# Patient Record
Sex: Female | Born: 1935 | Race: White | Hispanic: No | Smoking: Never smoker
Health system: Southern US, Community
[De-identification: ages and names within clinical notes are randomized; demographics above are authoritative.]

## PROBLEM LIST (undated history)

## (undated) DIAGNOSIS — I341 Nonrheumatic mitral (valve) prolapse: Secondary | ICD-10-CM

## (undated) DIAGNOSIS — G709 Myoneural disorder, unspecified: Secondary | ICD-10-CM

## (undated) DIAGNOSIS — G629 Polyneuropathy, unspecified: Secondary | ICD-10-CM

## (undated) DIAGNOSIS — C801 Malignant (primary) neoplasm, unspecified: Secondary | ICD-10-CM

## (undated) DIAGNOSIS — R011 Cardiac murmur, unspecified: Secondary | ICD-10-CM

## (undated) DIAGNOSIS — K219 Gastro-esophageal reflux disease without esophagitis: Secondary | ICD-10-CM

## (undated) DIAGNOSIS — G930 Cerebral cysts: Secondary | ICD-10-CM

## (undated) DIAGNOSIS — I4891 Unspecified atrial fibrillation: Secondary | ICD-10-CM

## (undated) DIAGNOSIS — M1990 Unspecified osteoarthritis, unspecified site: Secondary | ICD-10-CM

## (undated) DIAGNOSIS — M549 Dorsalgia, unspecified: Secondary | ICD-10-CM

## (undated) DIAGNOSIS — I639 Cerebral infarction, unspecified: Secondary | ICD-10-CM

## (undated) DIAGNOSIS — D510 Vitamin B12 deficiency anemia due to intrinsic factor deficiency: Secondary | ICD-10-CM

## (undated) DIAGNOSIS — G8929 Other chronic pain: Secondary | ICD-10-CM

## (undated) DIAGNOSIS — K449 Diaphragmatic hernia without obstruction or gangrene: Secondary | ICD-10-CM

## (undated) DIAGNOSIS — D689 Coagulation defect, unspecified: Secondary | ICD-10-CM

## (undated) DIAGNOSIS — Q8740 Marfan's syndrome, unspecified: Secondary | ICD-10-CM

## (undated) DIAGNOSIS — D649 Anemia, unspecified: Secondary | ICD-10-CM

## (undated) DIAGNOSIS — C169 Malignant neoplasm of stomach, unspecified: Secondary | ICD-10-CM

## (undated) DIAGNOSIS — C55 Malignant neoplasm of uterus, part unspecified: Secondary | ICD-10-CM

## (undated) DIAGNOSIS — M75101 Unspecified rotator cuff tear or rupture of right shoulder, not specified as traumatic: Secondary | ICD-10-CM

## (undated) DIAGNOSIS — F419 Anxiety disorder, unspecified: Secondary | ICD-10-CM

## (undated) DIAGNOSIS — K589 Irritable bowel syndrome without diarrhea: Secondary | ICD-10-CM

## (undated) DIAGNOSIS — R32 Unspecified urinary incontinence: Secondary | ICD-10-CM

## (undated) DIAGNOSIS — I10 Essential (primary) hypertension: Secondary | ICD-10-CM

## (undated) DIAGNOSIS — Z5189 Encounter for other specified aftercare: Secondary | ICD-10-CM

## (undated) DIAGNOSIS — O2230 Deep phlebothrombosis in pregnancy, unspecified trimester: Secondary | ICD-10-CM

## (undated) DIAGNOSIS — F32A Depression, unspecified: Secondary | ICD-10-CM

## (undated) DIAGNOSIS — F329 Major depressive disorder, single episode, unspecified: Secondary | ICD-10-CM

## (undated) DIAGNOSIS — N189 Chronic kidney disease, unspecified: Secondary | ICD-10-CM

## (undated) DIAGNOSIS — H3530 Unspecified macular degeneration: Secondary | ICD-10-CM

## (undated) DIAGNOSIS — E049 Nontoxic goiter, unspecified: Secondary | ICD-10-CM

## (undated) HISTORY — DX: Other chronic pain: G89.29

## (undated) HISTORY — DX: Anemia, unspecified: D64.9

## (undated) HISTORY — DX: Unspecified macular degeneration: H35.30

## (undated) HISTORY — PX: NOSE SURGERY: SHX723

## (undated) HISTORY — DX: Unspecified osteoarthritis, unspecified site: M19.90

## (undated) HISTORY — DX: Unspecified atrial fibrillation: I48.91

## (undated) HISTORY — DX: Polyneuropathy, unspecified: G62.9

## (undated) HISTORY — DX: Major depressive disorder, single episode, unspecified: F32.9

## (undated) HISTORY — DX: Encounter for other specified aftercare: Z51.89

## (undated) HISTORY — DX: Unspecified urinary incontinence: R32

## (undated) HISTORY — DX: Depression, unspecified: F32.A

## (undated) HISTORY — DX: Malignant neoplasm of uterus, part unspecified: C55

## (undated) HISTORY — DX: Gastro-esophageal reflux disease without esophagitis: K21.9

## (undated) HISTORY — DX: Vitamin B12 deficiency anemia due to intrinsic factor deficiency: D51.0

## (undated) HISTORY — DX: Dorsalgia, unspecified: M54.9

## (undated) HISTORY — DX: Cardiac murmur, unspecified: R01.1

## (undated) HISTORY — DX: Nonrheumatic mitral (valve) prolapse: I34.1

## (undated) HISTORY — DX: Unspecified rotator cuff tear or rupture of right shoulder, not specified as traumatic: M75.101

## (undated) HISTORY — DX: Malignant (primary) neoplasm, unspecified: C80.1

## (undated) HISTORY — DX: Myoneural disorder, unspecified: G70.9

## (undated) HISTORY — DX: Anxiety disorder, unspecified: F41.9

## (undated) HISTORY — DX: Diaphragmatic hernia without obstruction or gangrene: K44.9

## (undated) HISTORY — DX: Coagulation defect, unspecified: D68.9

## (undated) HISTORY — DX: Deep phlebothrombosis in pregnancy, unspecified trimester: O22.30

## (undated) HISTORY — DX: Cerebral cysts: G93.0

## (undated) HISTORY — PX: EYE SURGERY: SHX253

## (undated) HISTORY — DX: Nontoxic goiter, unspecified: E04.9

## (undated) HISTORY — DX: Cerebral infarction, unspecified: I63.9

## (undated) HISTORY — DX: Irritable bowel syndrome, unspecified: K58.9

## (undated) HISTORY — DX: Marfan syndrome, unspecified: Q87.40

## (undated) HISTORY — PX: CATARACT EXTRACTION: SUR2

---

## 1970-10-19 HISTORY — PX: ABDOMINAL HYSTERECTOMY: SHX81

## 1983-10-20 HISTORY — PX: ANTERIOR CERVICAL DISCECTOMY: SHX1160

## 2010-07-16 ENCOUNTER — Ambulatory Visit: Payer: Self-pay | Admitting: Cardiovascular Disease

## 2010-07-23 ENCOUNTER — Encounter: Payer: Self-pay | Admitting: Emergency Medicine

## 2010-07-31 ENCOUNTER — Ambulatory Visit: Payer: Self-pay | Admitting: Cardiology

## 2010-07-31 ENCOUNTER — Inpatient Hospital Stay (HOSPITAL_COMMUNITY)
Admission: EM | Admit: 2010-07-31 | Discharge: 2010-08-07 | Payer: Self-pay | Source: Home / Self Care | Admitting: Emergency Medicine

## 2010-08-05 ENCOUNTER — Ambulatory Visit: Payer: Self-pay | Admitting: Physical Medicine & Rehabilitation

## 2010-08-12 ENCOUNTER — Ambulatory Visit: Payer: Self-pay | Admitting: Emergency Medicine

## 2010-08-12 DIAGNOSIS — J309 Allergic rhinitis, unspecified: Secondary | ICD-10-CM | POA: Insufficient documentation

## 2010-08-12 DIAGNOSIS — I4891 Unspecified atrial fibrillation: Secondary | ICD-10-CM

## 2010-08-12 DIAGNOSIS — C549 Malignant neoplasm of corpus uteri, unspecified: Secondary | ICD-10-CM

## 2010-08-12 DIAGNOSIS — R05 Cough: Secondary | ICD-10-CM | POA: Insufficient documentation

## 2010-08-12 DIAGNOSIS — Z85828 Personal history of other malignant neoplasm of skin: Secondary | ICD-10-CM

## 2010-08-12 DIAGNOSIS — Z8679 Personal history of other diseases of the circulatory system: Secondary | ICD-10-CM | POA: Insufficient documentation

## 2010-08-12 DIAGNOSIS — I743 Embolism and thrombosis of arteries of the lower extremities: Secondary | ICD-10-CM

## 2010-08-15 ENCOUNTER — Ambulatory Visit: Payer: Self-pay | Admitting: Cardiovascular Disease

## 2010-08-19 ENCOUNTER — Encounter: Payer: Self-pay | Admitting: Emergency Medicine

## 2010-08-19 ENCOUNTER — Ambulatory Visit (HOSPITAL_COMMUNITY): Admission: RE | Admit: 2010-08-19 | Discharge: 2010-08-19 | Payer: Self-pay | Admitting: Emergency Medicine

## 2010-09-03 ENCOUNTER — Telehealth (INDEPENDENT_AMBULATORY_CARE_PROVIDER_SITE_OTHER): Payer: Self-pay | Admitting: *Deleted

## 2010-09-04 ENCOUNTER — Ambulatory Visit: Payer: Self-pay | Admitting: Emergency Medicine

## 2010-09-04 DIAGNOSIS — J984 Other disorders of lung: Secondary | ICD-10-CM

## 2010-11-08 ENCOUNTER — Encounter: Payer: Self-pay | Admitting: Cardiology

## 2010-11-18 ENCOUNTER — Ambulatory Visit: Payer: Self-pay | Admitting: Cardiovascular Disease

## 2010-11-18 NOTE — Progress Notes (Signed)
Summary: MBSS results - OV scheduled  Phone Note Call from Patient Call back at [phone]   Caller: Daughter cindy Call For: byrum Summary of Call: calling for swallowing test results Initial call taken by: Rickard Patience,  September 03, 2010 10:45 AM  Follow-up for Phone Call        Per last OV note from 08/12/10, pt was to have MBSS and follow up wtih RB after test completed.  Results are in EMR.    Called, spoke with pt's daughter, Arline Asp.  She was informed per last OV, RB wanted to pt f/u after test completed.  She is ok with this.  ROV scheduled for 09/04/10 at 10am.   Follow-up by: Gweneth Dimitri RN,  September 03, 2010 10:57 AM

## 2010-11-18 NOTE — Letter (Signed)
[facility]   Imported By: Sherian Rein 08/20/2010 08:27:47  _____________________________________________________________________  External Attachment:    Type:   Image     Comment:   External Document

## 2010-11-18 NOTE — Miscellaneous (Signed)
Summary: [facility]  [place]   Imported By: Lester Saguache 08/26/2010 08:59:45  _____________________________________________________________________  External Attachment:    Type:   Image     Comment:   External Document

## 2010-11-18 NOTE — Assessment & Plan Note (Signed)
Summary: cough, dysphagia, lingular nodule   Visit Type:  Follow-up Copy to:  Dr. Benedetto Goad Primary Provider/Referring Provider:  Dr. Benedetto Goad  CC:  Patient here to discuss swallow results...c/o a hacky cough.  History of Present Illness: 77 yo never smoker, hx of RLE DVT, IBS, A Fib, Marfan;s, MV prolapse. She was admitted for CP and SOB 07/31/10. She underwent stress testing, V/Q. CXR showed lingular nodule. Referred by Dr [name] for cough and dyspnea. She describes a chest congestion that began this Summer when she got URI. Has persistant hoarse voice. She describes slow progressive dysphagia, difficulty swallowing, getting choked associated with food and drink. Coughing fits that then produce thick clear mucous. She reports that she has been on a modified diet before but not now.   ROV 09/04/10 -- returns for cough that is worst with taking food by mouth. Also hoarse voice, UA mucous  I sent her for barium swallow, shows poor transit of solids at upper esophagus until liquids follow. No overt aspiration. She is on fluticasone spray. Also note lingular nodule seen on CXR done by Dr [name].     Current Medications (verified): 1)  Vitamin B-12 1000 Mcg Tabs (Cyanocobalamin) .... Once Daily 2)  Ultram Er 100 Mg Xr24h-Tab (Tramadol Hcl) .... Once Daily 3)  Aspirin 81 Mg Tabs (Aspirin) .... Once Daily 4)  Daily Multiple Vitamins  Tabs (Multiple Vitamin) .... Once Daily 5)  Nexium 40 Mg Cpdr (Esomeprazole Magnesium) .... Once Daily 6)  Miralax  Powd (Polyethylene Glycol 3350) .... As Directed 7)  Senna Tablets .... As Directed 8)  Colace 100 Mg Caps (Docusate Sodium) .... Once Daily 9)  Simvastatin 40 Mg Tabs (Simvastatin) .... Once Daily At Bedtime 10)  Ditropan Xl 10 Mg Xr24h-Tab (Oxybutynin Chloride) .... Once Daily 11)  Fluticasone Propionate 50 Mcg/act Susp (Fluticasone Propionate) .... One Spray Each Nostrile Two Times A Day 12)  Tylenol 8 Hour 650 Mg Cr-Tabs (Acetaminophen) ....  One Tablet Four Times A Day 13)  Neurontin 400 Mg Caps (Gabapentin) .... 3 Capsules Three Times A Day 14)  Coumadin 3 Mg Tabs (Warfarin Sodium) .... Use As Directed 15)  Alphagan P 0.15 % Soln (Brimonidine Tartrate) .... Instill Drops Every 12 Hours  Allergies (verified): 1)  ! * Pcn and All Derivatives  Vital Signs:  Patient profile:   77 year old female Height:      69 inches (175.26 cm) Weight:      187.19 pounds (85.09 kg) BMI:     27.74 O2 Sat:      96 % on Room air Temp:     97.6 degrees F (36.44 degrees C) oral Pulse rate:   90 / minute BP sitting:   108 / 64  (left arm) Cuff size:   large  Vitals Entered By: Michel Bickers CMA (September 04, 2010 10:12 AM)  O2 Sat at Rest %:  96 O2 Flow:  Room air CC: Patient here to discuss swallow results...c/o a hacky cough Comments Medications reviewed with patient Michel Bickers CMA  September 04, 2010 10:19 AM   Physical Exam  General:  debilitated woman in wheelchair Head:  Marfans Eyes:  conjunctiva and sclera clear Nose:  no deformity, discharge, inflammation, or lesions Mouth:  no deformity or lesions Neck:  no masses, thyromegaly, or abnormal cervical nodes Lungs:  clear bilaterally Heart:  regular rate and rhythm, S1, S2 without murmurs, rubs, gallops, or clicks Abdomen:  not examined Extremities:  large hands, no edema Neurologic:  weak R LE and UE Psych:  alert and cooperative; normal mood and affect; normal attention span and concentration   Impression & Recommendations:  Problem # 1:  COUGH (ICD-786.2)  - stop your fluticasone spray temporarily until your nose bleeding resolves - start loratadine 10mg  once daily  - eat small bites and follow each bite with a liquid.  - we may decide at some point to refer you to gastroenterology regarding your swallowing.  - you may restart your mucinex - try your Nathaniel to avoid throat clearing - follow up with Dr Delton Coombes in 4 months with a CXR  Orders: Est. Patient Level IV  (04540)  Problem # 2:  PULMONARY NODULE (ICD-518.89)  - repeat CXR next time to follow nodule (never smoker)  Orders: Est. Patient Level IV (98119)  Medications Added to Medication List This Visit: 1)  Coumadin 3 Mg Tabs (Warfarin sodium) .... Use as directed  Patient Instructions: 1)  - stop your fluticasone spray temporarily until your nose bleeding resolves 2)  - start loratadine 10mg  once daily  3)  - eat small bites and follow each bite with a liquid.  4)  - we may decide at some point to refer you to gastroenterology regarding your swallowing.  5)  - you may restart your mucinex 6)  - try your Leighty to avoid throat clearing 7)  - follow up with Dr Delton Coombes in 4 months with a CXR

## 2010-11-18 NOTE — Assessment & Plan Note (Signed)
Summary: cough, dysphagia   Visit Type:  Initial Consult Copy to:  Dr. Benedetto Goad Primary Donnamae [place]/Referring Victorina Kable:  Dr. Benedetto Goad  CC:  Pulmonary consult....  History of Present Illness: 77 yo never smoker, hx of RLE DVT, IBS, A Fib, Marfan;s, MV prolapse. She was admitted for CP and SOB 07/31/10. She underwent stress testing, V/Q that was . CXR showed lingular nodule. Referred by Dr [name] for cough and dyspnea. She describes a chest congestion that began this Summer when she got URI. Has persistant hoarse voice. She describes slow progressive dysphagia, difficulty swallowing, getting choked associated with food and drink. Coughing fits that then produce thick clear mucous. She reports that she has been on a modified diet before but not now.   Preventive Screening-Counseling & Management  Alcohol-Tobacco     Alcohol drinks/day: 0     Smoking Status: never  Current Medications (verified): 1)  Vitamin B-12 1000 Mcg Tabs (Cyanocobalamin) .... Once Daily 2)  Ultram Er 100 Mg Xr24h-Tab (Tramadol Hcl) .... Once Daily 3)  Aspirin 81 Mg Tabs (Aspirin) .... Once Daily 4)  Daily Multiple Vitamins  Tabs (Multiple Vitamin) .... Once Daily 5)  Nexium 40 Mg Cpdr (Esomeprazole Magnesium) .... Once Daily 6)  Miralax  Powd (Polyethylene Glycol 3350) .... As Directed 7)  Mucinex 600 Mg Xr12h-Tab (Guaifenesin) .... One Tablet Two Times A Day As Needed 8)  Senna Tablets .... As Directed 9)  Colace 100 Mg Caps (Docusate Sodium) .... Once Daily 10)  Simvastatin 40 Mg Tabs (Simvastatin) .... Once Daily At Bedtime 11)  Ditropan Xl 10 Mg Xr24h-Tab (Oxybutynin Chloride) .... Once Daily 12)  Fluticasone Propionate 50 Mcg/act Susp (Fluticasone Propionate) .... One Spray Each Nostrile Two Times A Day 13)  Tylenol 8 Hour 650 Mg Cr-Tabs (Acetaminophen) .... One Tablet Four Times A Day 14)  Neurontin 400 Mg Caps (Gabapentin) .... 3 Capsules Three Times A Day 15)  Coumadin 3 Mg Tabs (Warfarin Sodium) .Marland Kitchen.. 1  Tablet Once Daily Except Mon and Thurs 1/2 On Those Days 16)  Alphagan P 0.15 % Soln (Brimonidine Tartrate) .... Instill Drops Every 12 Hours  Allergies (verified): 1)  ! * Pcn and All Derivatives  Past History:  Past Medical History: THROMBOEMBOLISM, LEG (ICD-444.22) SKIN CANCER, HX OF (ICD-V10.83) CANCER, ENDOMETRIUM (ICD-182.0) ANGINA, HX OF (ICD-V12.50), reassuring stress test 10/11.  Hx of ATRIAL FIBRILLATION (ICD-427.31) ALLERGIC RHINITIS (ICD-477.9) IBS Marfan's Syndrome  Past Surgical History: Neck surgery---1985, ant cervical discectomy Hysterectomy in 1972 Cataract Extraction  Family History: Family History Emphysema ---father Family History MI/Heart Attack---father Family History Rheumatoid Arthritisfather CHF---mother Liver cancer---mother Cancer of the blood---father  Social History: Patient never smoked.  Widowed Lives with her daughterAlcohol drinks/day:  0 Smoking Status:  never  Vital Signs:  Patient profile:   77 year old female Height:      69 inches (175.26 cm) Weight:      189 pounds (85.91 kg) BMI:     28.01 O2 Sat:      94 % on Room air Temp:     97.5 degrees F (36.39 degrees C) oral Pulse rate:   99 / minute BP sitting:   118 / 78  (right arm) Cuff size:   regular  Vitals Entered By: Michel Bickers CMA (August 12, 2010 3:48 PM)  O2 Sat at Rest %:  94 O2 Flow:  Room air CC: Pulmonary consult... Is Patient Diabetic? Yes Comments Medications reviewed with patient Michel Bickers [facility]  August 12, 2010 3:48 PM  Physical Exam  General:  debilitated woman in wheelchair Head:  Marfans Eyes:  conjunctiva and sclera clear Nose:  no deformity, discharge, inflammation, or lesions Mouth:  no deformity or lesions Neck:  no masses, thyromegaly, or abnormal cervical nodes Lungs:  clear bilaterally Heart:  regular rate and rhythm, S1, S2 without murmurs, rubs, gallops, or clicks Abdomen:  not examined Extremities:  large hands, no  edema Neurologic:  weak R LE and UE Psych:  alert and cooperative; normal mood and affect; normal attention span and concentration   Impression & Recommendations:  Problem # 1:  COUGH (ICD-786.2) Always assoc with taking by mouth. Strongly suspect she is aspirating. Need to get the modified BS asap, will arrange this today  Medications Added to Medication List This Visit: 1)  Vitamin B-12 1000 Mcg Tabs (Cyanocobalamin) .... Once daily 2)  Ultram Er 100 Mg Xr24h-tab (Tramadol hcl) .... Once daily 3)  Aspirin 81 Mg Tabs (Aspirin) .... Once daily 4)  Daily Multiple Vitamins Tabs (Multiple vitamin) .... Once daily 5)  Nexium 40 Mg Cpdr (Esomeprazole magnesium) .... Once daily 6)  Miralax Powd (Polyethylene glycol 3350) .... As directed 7)  Mucinex 600 Mg Xr12h-tab (Guaifenesin) .... One tablet two times a day as needed 8)  Senna Tablets  .... As directed 9)  Colace 100 Mg Caps (Docusate sodium) .... Once daily 10)  Simvastatin 40 Mg Tabs (Simvastatin) .... Once daily at bedtime 11)  Ditropan Xl 10 Mg Xr24h-tab (Oxybutynin chloride) .... Once daily 12)  Fluticasone Propionate 50 Mcg/act Susp (Fluticasone propionate) .... One spray each nostrile two times a day 13)  Tylenol 8 Hour 650 Mg Cr-tabs (Acetaminophen) .... One tablet four times a day 14)  Neurontin 400 Mg Caps (Gabapentin) .... 3 capsules three times a day 15)  Coumadin 3 Mg Tabs (Warfarin sodium) .Marland Kitchen.. 1 tablet once daily except mon and thurs 1/2 on those days 16)  Alphagan P 0.15 % Soln (Brimonidine tartrate) .... Instill drops every 12 hours  Other Orders: Consultation Level IV (16109) Radiology Referral (Radiology)  Patient Instructions: 1)  We will arrange for your swallowing evaluation 2)  Follow up with Dr Delton Coombes after your testing is completed.

## 2010-12-31 LAB — BASIC METABOLIC PANEL
BUN: 11 mg/dL (ref 6–23)
BUN: 11 mg/dL (ref 6–23)
CO2: 26 mEq/L (ref 19–32)
CO2: 28 mEq/L (ref 19–32)
Calcium: 9.4 mg/dL (ref 8.4–10.5)
Chloride: 105 mEq/L (ref 96–112)
GFR calc Af Amer: >60 mL/min (ref >60)
GFR calc non Af Amer: >60 mL/min (ref >60)
Glucose, Bld: 100 mg/dL — ABNORMAL HIGH (ref 70–99)
Glucose, Bld: 95 mg/dL (ref 70–99)
Sodium: 138 mEq/L (ref 135–145)

## 2010-12-31 LAB — CBC
HCT: 33.4 % — ABNORMAL LOW (ref 36.0–46.0)
HCT: 33.7 % — ABNORMAL LOW (ref 36.0–46.0)
Hemoglobin: 10.9 g/dL — ABNORMAL LOW (ref 12.0–15.0)
Hemoglobin: 11 g/dL — ABNORMAL LOW (ref 12.0–15.0)
MCH: 30.5 pg (ref 26.0–34.0)
MCH: 30.6 pg (ref 26.0–34.0)
MCH: 31 pg (ref 26.0–34.0)
MCHC: 32 g/dL (ref 30.0–36.0)
MCHC: 32.3 g/dL (ref 30.0–36.0)
MCHC: 32.3 g/dL (ref 30.0–36.0)
MCHC: 32.6 g/dL (ref 30.0–36.0)
MCV: 94 fL (ref 78.0–100.0)
MCV: 96.8 fL (ref 78.0–100.0)
Platelets: 197 10*3/uL (ref 150–400)
RDW: 13.9 % (ref 11.5–15.5)
RDW: 14 % (ref 11.5–15.5)
RDW: 14.1 % (ref 11.5–15.5)
WBC: 5 10*3/uL (ref 4.0–10.5)
WBC: 5.7 10*3/uL (ref 4.0–10.5)

## 2010-12-31 LAB — GLUCOSE, CAPILLARY
Glucose-Capillary: 102 mg/dL — ABNORMAL HIGH (ref 70–99)
Glucose-Capillary: 109 mg/dL — ABNORMAL HIGH (ref 70–99)
Glucose-Capillary: 110 mg/dL — ABNORMAL HIGH (ref 70–99)
Glucose-Capillary: 118 mg/dL — ABNORMAL HIGH (ref 70–99)
Glucose-Capillary: 123 mg/dL — ABNORMAL HIGH (ref 70–99)
Glucose-Capillary: 128 mg/dL — ABNORMAL HIGH (ref 70–99)
Glucose-Capillary: 140 mg/dL — ABNORMAL HIGH (ref 70–99)

## 2010-12-31 LAB — PROTIME-INR
INR: 1.69 — ABNORMAL HIGH (ref 0.00–1.49)
INR: 2 — ABNORMAL HIGH (ref 0.00–1.49)
Prothrombin Time: 18.9 seconds — ABNORMAL HIGH (ref 11.6–15.2)
Prothrombin Time: 19.4 seconds — ABNORMAL HIGH (ref 11.6–15.2)
Prothrombin Time: 20.1 seconds — ABNORMAL HIGH (ref 11.6–15.2)
Prothrombin Time: 21.4 seconds — ABNORMAL HIGH (ref 11.6–15.2)
Prothrombin Time: 22.4 seconds — ABNORMAL HIGH (ref 11.6–15.2)

## 2011-01-01 LAB — CBC
HCT: 36.1 % (ref 36.0–46.0)
MCH: 31 pg (ref 26.0–34.0)
MCV: 96.5 fL (ref 78.0–100.0)
Platelets: 186 10*3/uL (ref 150–400)
RDW: 14 % (ref 11.5–15.5)
WBC: 5.1 10*3/uL (ref 4.0–10.5)

## 2011-01-01 LAB — CARDIAC PANEL(CRET KIN+CKTOT+MB+TROPI)
CK, MB: 2.6 ng/mL (ref 0.3–4.0)
Relative Index: 2.2 (ref 0.0–2.5)
Relative Index: 2.4 (ref 0.0–2.5)
Troponin I: 0.02 ng/mL (ref 0.00–0.06)

## 2011-01-01 LAB — DIFFERENTIAL
Basophils Absolute: 0 10*3/uL (ref 0.0–0.1)
Eosinophils Absolute: 0.3 10*3/uL (ref 0.0–0.7)
Eosinophils Relative: 5 % (ref 0–5)
Lymphocytes Relative: 25 % (ref 12–46)
Monocytes Absolute: 0.6 10*3/uL (ref 0.1–1.0)

## 2011-01-01 LAB — BASIC METABOLIC PANEL
CO2: 29 mEq/L (ref 19–32)
Calcium: 9.3 mg/dL (ref 8.4–10.5)
Creatinine, Ser: 0.79 mg/dL (ref 0.4–1.2)
GFR calc Af Amer: >60 mL/min (ref >60)
Sodium: 139 mEq/L (ref 135–145)

## 2011-01-01 LAB — URINALYSIS, ROUTINE W REFLEX MICROSCOPIC
Ketones, ur: NEGATIVE mg/dL
Nitrite: NEGATIVE
Protein, ur: NEGATIVE mg/dL
Urobilinogen, UA: 0.2 mg/dL (ref 0.0–1.0)

## 2011-01-01 LAB — POCT CARDIAC MARKERS
CKMB, poc: <1 ng/mL — ABNORMAL LOW (ref 1.0–8.0)
Myoglobin, poc: 72.2 ng/mL (ref 12–200)
Troponin i, poc: <0.05 ng/mL (ref 0.00–0.09)
Troponin i, poc: <0.05 ng/mL (ref 0.00–0.09)

## 2011-01-01 LAB — CK TOTAL AND CKMB (NOT AT ARMC)
CK, MB: 3.1 ng/mL (ref 0.3–4.0)
Relative Index: 2.4 (ref 0.0–2.5)
Total CK: 128 U/L (ref 7–177)

## 2011-01-01 LAB — URINE MICROSCOPIC-ADD ON

## 2011-01-01 LAB — APTT: aPTT: 49 seconds — ABNORMAL HIGH (ref 24–37)

## 2011-02-16 ENCOUNTER — Emergency Department (HOSPITAL_COMMUNITY): Payer: Medicare Other

## 2011-02-16 ENCOUNTER — Emergency Department (HOSPITAL_COMMUNITY)
Admission: EM | Admit: 2011-02-16 | Discharge: 2011-02-17 | Disposition: A | Discharge status: Home / Self Care | Payer: Medicare Other | Attending: Emergency Medicine | Admitting: Emergency Medicine

## 2011-02-16 DIAGNOSIS — S20229A Contusion of unspecified back wall of thorax, initial encounter: Secondary | ICD-10-CM | POA: Insufficient documentation

## 2011-02-16 DIAGNOSIS — M129 Arthropathy, unspecified: Secondary | ICD-10-CM | POA: Insufficient documentation

## 2011-02-16 DIAGNOSIS — R51 Headache: Secondary | ICD-10-CM | POA: Insufficient documentation

## 2011-02-16 DIAGNOSIS — M545 Low back pain, unspecified: Secondary | ICD-10-CM | POA: Insufficient documentation

## 2011-02-16 DIAGNOSIS — I10 Essential (primary) hypertension: Secondary | ICD-10-CM | POA: Insufficient documentation

## 2011-02-16 DIAGNOSIS — R5381 Other malaise: Secondary | ICD-10-CM | POA: Insufficient documentation

## 2011-02-16 DIAGNOSIS — R0609 Other forms of dyspnea: Secondary | ICD-10-CM | POA: Insufficient documentation

## 2011-02-16 DIAGNOSIS — R071 Chest pain on breathing: Secondary | ICD-10-CM | POA: Insufficient documentation

## 2011-02-16 DIAGNOSIS — I4891 Unspecified atrial fibrillation: Secondary | ICD-10-CM | POA: Insufficient documentation

## 2011-02-16 DIAGNOSIS — R110 Nausea: Secondary | ICD-10-CM | POA: Insufficient documentation

## 2011-02-16 DIAGNOSIS — Z86718 Personal history of other venous thrombosis and embolism: Secondary | ICD-10-CM | POA: Insufficient documentation

## 2011-02-16 DIAGNOSIS — K589 Irritable bowel syndrome without diarrhea: Secondary | ICD-10-CM | POA: Insufficient documentation

## 2011-02-16 DIAGNOSIS — S20219A Contusion of unspecified front wall of thorax, initial encounter: Secondary | ICD-10-CM | POA: Insufficient documentation

## 2011-02-16 DIAGNOSIS — R0989 Other specified symptoms and signs involving the circulatory and respiratory systems: Secondary | ICD-10-CM | POA: Insufficient documentation

## 2011-02-16 DIAGNOSIS — R5383 Other fatigue: Secondary | ICD-10-CM | POA: Insufficient documentation

## 2011-02-16 DIAGNOSIS — E785 Hyperlipidemia, unspecified: Secondary | ICD-10-CM | POA: Insufficient documentation

## 2011-02-16 DIAGNOSIS — Z79899 Other long term (current) drug therapy: Secondary | ICD-10-CM | POA: Insufficient documentation

## 2011-02-16 DIAGNOSIS — W010XXA Fall on same level from slipping, tripping and stumbling without subsequent striking against object, initial encounter: Secondary | ICD-10-CM | POA: Insufficient documentation

## 2011-02-16 DIAGNOSIS — M542 Cervicalgia: Secondary | ICD-10-CM | POA: Insufficient documentation

## 2011-02-16 DIAGNOSIS — Z7982 Long term (current) use of aspirin: Secondary | ICD-10-CM | POA: Insufficient documentation

## 2011-02-16 DIAGNOSIS — Y92009 Unspecified place in unspecified non-institutional (private) residence as the place of occurrence of the external cause: Secondary | ICD-10-CM | POA: Insufficient documentation

## 2011-02-16 DIAGNOSIS — Z7901 Long term (current) use of anticoagulants: Secondary | ICD-10-CM | POA: Insufficient documentation

## 2011-02-16 DIAGNOSIS — IMO0002 Reserved for concepts with insufficient information to code with codable children: Secondary | ICD-10-CM | POA: Insufficient documentation

## 2011-02-16 DIAGNOSIS — G8929 Other chronic pain: Secondary | ICD-10-CM | POA: Insufficient documentation

## 2011-02-16 LAB — DIFFERENTIAL
Lymphocytes Relative: 24 % (ref 12–46)
Lymphs Abs: 1.3 10*3/uL (ref 0.7–4.0)
Neutrophils Relative %: 61 % (ref 43–77)

## 2011-02-16 LAB — CBC
HCT: 37.8 % (ref 36.0–46.0)
Hemoglobin: 12.3 g/dL (ref 12.0–15.0)
MCV: 98.7 fL (ref 78.0–100.0)
RBC: 3.83 MIL/uL — ABNORMAL LOW (ref 3.87–5.11)
WBC: 5.3 10*3/uL (ref 4.0–10.5)

## 2011-02-17 LAB — URINALYSIS, ROUTINE W REFLEX MICROSCOPIC
Glucose, UA: NEGATIVE mg/dL
Ketones, ur: NEGATIVE mg/dL
Protein, ur: NEGATIVE mg/dL

## 2011-02-17 LAB — TROPONIN I: Troponin I: 0.02 ng/mL (ref 0.00–0.06)

## 2011-02-17 LAB — BASIC METABOLIC PANEL
Chloride: 107 mEq/L (ref 96–112)
Creatinine, Ser: 0.74 mg/dL (ref 0.4–1.2)
GFR calc Af Amer: >60 mL/min (ref >60)
Potassium: 3.8 mEq/L (ref 3.5–5.1)
Sodium: 140 mEq/L (ref 135–145)

## 2011-02-17 LAB — BRAIN NATRIURETIC PEPTIDE: Pro B Natriuretic peptide (BNP): 164 pg/mL — ABNORMAL HIGH (ref 0.0–100.0)

## 2011-02-17 LAB — CK TOTAL AND CKMB (NOT AT ARMC)
CK, MB: 3.4 ng/mL (ref 0.3–4.0)
Total CK: 104 U/L (ref 7–177)

## 2011-03-02 ENCOUNTER — Other Ambulatory Visit: Payer: Self-pay | Admitting: Family Medicine

## 2011-03-04 ENCOUNTER — Ambulatory Visit
Admission: RE | Admit: 2011-03-04 | Discharge: 2011-03-04 | Disposition: A | Discharge status: Home / Self Care | Payer: Medicare Other | Source: Ambulatory Visit | Attending: Family Medicine | Admitting: Family Medicine

## 2011-04-24 ENCOUNTER — Emergency Department (HOSPITAL_COMMUNITY): Payer: Medicare Other

## 2011-04-24 ENCOUNTER — Inpatient Hospital Stay (HOSPITAL_COMMUNITY)
Admission: EM | Admit: 2011-04-24 | Discharge: 2011-04-30 | DRG: 103 | Disposition: A | Discharge status: Skilled Nursing Facility | Payer: Medicare Other | Attending: Internal Medicine | Admitting: Internal Medicine

## 2011-04-24 DIAGNOSIS — G959 Disease of spinal cord, unspecified: Secondary | ICD-10-CM | POA: Diagnosis present

## 2011-04-24 DIAGNOSIS — Z86718 Personal history of other venous thrombosis and embolism: Secondary | ICD-10-CM

## 2011-04-24 DIAGNOSIS — E785 Hyperlipidemia, unspecified: Secondary | ICD-10-CM | POA: Diagnosis present

## 2011-04-24 DIAGNOSIS — I4891 Unspecified atrial fibrillation: Secondary | ICD-10-CM | POA: Diagnosis present

## 2011-04-24 DIAGNOSIS — R339 Retention of urine, unspecified: Secondary | ICD-10-CM | POA: Diagnosis present

## 2011-04-24 DIAGNOSIS — R51 Headache: Principal | ICD-10-CM | POA: Diagnosis present

## 2011-04-24 DIAGNOSIS — Z7901 Long term (current) use of anticoagulants: Secondary | ICD-10-CM

## 2011-04-24 DIAGNOSIS — G609 Hereditary and idiopathic neuropathy, unspecified: Secondary | ICD-10-CM | POA: Diagnosis present

## 2011-04-24 DIAGNOSIS — R29898 Other symptoms and signs involving the musculoskeletal system: Secondary | ICD-10-CM | POA: Diagnosis present

## 2011-04-24 DIAGNOSIS — R5381 Other malaise: Secondary | ICD-10-CM | POA: Diagnosis present

## 2011-04-24 DIAGNOSIS — R791 Abnormal coagulation profile: Secondary | ICD-10-CM | POA: Diagnosis present

## 2011-04-24 DIAGNOSIS — E876 Hypokalemia: Secondary | ICD-10-CM | POA: Diagnosis present

## 2011-04-24 DIAGNOSIS — K5900 Constipation, unspecified: Secondary | ICD-10-CM | POA: Diagnosis present

## 2011-04-24 LAB — COMPREHENSIVE METABOLIC PANEL
AST: 17 U/L (ref 0–37)
Albumin: 3.7 g/dL (ref 3.5–5.2)
BUN: 18 mg/dL (ref 6–23)
Chloride: 106 mEq/L (ref 96–112)
Creatinine, Ser: 0.51 mg/dL (ref 0.50–1.10)
Potassium: 4.2 mEq/L (ref 3.5–5.1)
Total Protein: 6.3 g/dL (ref 6.0–8.3)

## 2011-04-24 LAB — BASIC METABOLIC PANEL
CO2: 21 mEq/L (ref 19–32)
Chloride: 99 mEq/L (ref 96–112)
Potassium: 3 mEq/L — ABNORMAL LOW (ref 3.5–5.1)
Sodium: 133 mEq/L — ABNORMAL LOW (ref 135–145)

## 2011-04-24 LAB — DIFFERENTIAL
Eosinophils Absolute: 0.2 10*3/uL (ref 0.0–0.7)
Eosinophils Relative: 3 % (ref 0–5)
Lymphs Abs: 1.4 10*3/uL (ref 0.7–4.0)

## 2011-04-24 LAB — CBC
MCH: 33 pg (ref 26.0–34.0)
MCHC: 33.8 g/dL (ref 30.0–36.0)
MCV: 97.5 fL (ref 78.0–100.0)
Platelets: 150 10*3/uL (ref 150–400)
RDW: 14.1 % (ref 11.5–15.5)
WBC: 5.6 10*3/uL (ref 4.0–10.5)

## 2011-04-24 LAB — TROPONIN I: Troponin I: <0.3 ng/mL (ref <0.30)

## 2011-04-24 LAB — APTT: aPTT: 42 seconds — ABNORMAL HIGH (ref 24–37)

## 2011-04-24 LAB — URINALYSIS, ROUTINE W REFLEX MICROSCOPIC
Bilirubin Urine: NEGATIVE
Ketones, ur: NEGATIVE mg/dL
Leukocytes, UA: NEGATIVE
Nitrite: NEGATIVE
Specific Gravity, Urine: 1.009 (ref 1.005–1.030)
Urobilinogen, UA: 0.2 mg/dL (ref 0.0–1.0)
pH: 7 (ref 5.0–8.0)

## 2011-04-24 LAB — SEDIMENTATION RATE: Sed Rate: 10 mm/hr (ref 0–22)

## 2011-04-25 ENCOUNTER — Other Ambulatory Visit (HOSPITAL_COMMUNITY): Payer: Medicare Other

## 2011-04-25 ENCOUNTER — Observation Stay (HOSPITAL_COMMUNITY): Payer: Medicare Other

## 2011-04-25 LAB — CARDIAC PANEL(CRET KIN+CKTOT+MB+TROPI)
CK, MB: 2.7 ng/mL (ref 0.3–4.0)
CK, MB: 3 ng/mL (ref 0.3–4.0)
Relative Index: INVALID (ref 0.0–2.5)
Total CK: 85 U/L (ref 7–177)
Troponin I: <0.3 ng/mL (ref <0.30)

## 2011-04-25 LAB — BASIC METABOLIC PANEL
CO2: 30 mEq/L (ref 19–32)
Chloride: 106 mEq/L (ref 96–112)
GFR calc non Af Amer: >60 mL/min (ref >60)
Glucose, Bld: 91 mg/dL (ref 70–99)
Potassium: 4.9 mEq/L (ref 3.5–5.1)
Sodium: 143 mEq/L (ref 135–145)

## 2011-04-25 LAB — CBC
HCT: 36.4 % (ref 36.0–46.0)
Hemoglobin: 11.8 g/dL — ABNORMAL LOW (ref 12.0–15.0)
MCH: 31.8 pg (ref 26.0–34.0)
MCHC: 32.4 g/dL (ref 30.0–36.0)
MCV: 98.1 fL (ref 78.0–100.0)
RDW: 14.2 % (ref 11.5–15.5)

## 2011-04-25 LAB — LIPID PANEL
HDL: 47 mg/dL (ref >39)
LDL Cholesterol: 63 mg/dL (ref 0–99)
Total CHOL/HDL Ratio: 2.8 RATIO
Triglycerides: 115 mg/dL (ref <150)

## 2011-04-25 LAB — HEMOGLOBIN A1C: Hgb A1c MFr Bld: 5.6 % (ref <5.7)

## 2011-04-25 LAB — TSH: TSH: 0.662 u[IU]/mL (ref 0.350–4.500)

## 2011-04-26 ENCOUNTER — Observation Stay (HOSPITAL_COMMUNITY): Payer: Medicare Other

## 2011-04-27 LAB — PROTIME-INR: Prothrombin Time: 31.8 seconds — ABNORMAL HIGH (ref 11.6–15.2)

## 2011-04-28 LAB — PROTIME-INR
INR: 4.41 — ABNORMAL HIGH (ref 0.00–1.49)
Prothrombin Time: 42.7 seconds — ABNORMAL HIGH (ref 11.6–15.2)

## 2011-04-29 LAB — PROTIME-INR: Prothrombin Time: 27.9 seconds — ABNORMAL HIGH (ref 11.6–15.2)

## 2011-04-30 LAB — PROTIME-INR: Prothrombin Time: 23.3 seconds — ABNORMAL HIGH (ref 11.6–15.2)

## 2011-05-03 NOTE — Discharge Summary (Signed)
Charlene Black, Charlene Black NO.:  [id number]  MEDICAL RECORD NO.:  [id number]  LOCATION:  3035                         FACILITY:  [facility]  PHYSICIAN:  Jeoffrey Massed, MD    DATE OF BIRTH:  06-Jul-1934  DATE OF ADMISSION:  04/24/2011 DATE OF DISCHARGE:                        DISCHARGE SUMMARY - REFERRING   ADDENDUM:  PRIMARY CARE PRACTITIONER:  [name], M.D.  PRIMARY NEUROLOGIST:  Dr. Terrace Arabia of [facility].  For further details, please see the discharge summary that was dictated by me on July 9, job [phone].  The patient initially was supposed to go home with home health services.  However, at the family's request and upon further evaluation by Occupational Therapy services, it was felt that the patient might benefit from subacute rehab at a skilled nursing facility and currently we are awaiting a bed to be available before the patient is discharged over there.  In interim, the patient continues to have intermittent headaches, now involving the entire head and continues to have pain on the right side of the body mostly.  I did have a long discussion with the patient yesterday and again with the patient and the daughter as well today.  I have explained to them that all of her investigation so far is negative.  I also did discuss over the phone with Dr. Wynetta Emery of Neurosurgery who did review the MRI images from this admission as well as last admission, and he also did say that he could not offer the patient any further services here.  He suggested that if the patient's family and the patient do desire to pursue surgical options then they need to follow up at highly specialized centers like [facility].  Upon my discussion with the patient and the patient's daughter, they have already had evaluation at [facility] for this arachnoid cyst and have been clearly told that this is inoperable and if any sort of surgery was attempted, the patient  has been told that it would probably cause more harm than good with permanent disabling effects like paralysis.  Obviously, both the patient and the patient's daughter are frustrated by these issues.  Patient is already on 1200 mg of Neurontin 3 times a day and is taking Tramadol.  It seems that most of these issues are chronic in nature with no good solutions and insight.  I have changed her tramadol to short-acting to be given every 6 hours and I have added Cymbalta.  Hopefully, these issues will be controlled with supportive care and titration of her pain management as noted above.  Neurology has also signed off the case and has suggested no further workup while in the hospital as well.  As noted in my prior discharge summary, I have also had a discussion with Dr. Terrace Arabia from [facility] who does suggest a non-urgent follow up with her in the clinic to further continued care of her neuropathy issues.  DISCHARGE MEDICATIONS: 1. Cymbalta 20 mg one tablet daily. 2. Neurontin 1200 mg p.o. 3 times a day. 3. Senokot 2 tablets twice daily. 4. Tramadol 50 mg one tablet 4 times a day. 5. MiraLax 17 g one tablet p.o. twice  daily. 6. Alphagan one drop in both eyes twice daily. 7. Aspirin 81 mg one tablet daily. 8. Colace 100 mg one capsule every evening. 9. Coumadin 3 mg Tuesdays, Thursdays, and Saturdays.  However, this is     currently on hold because the INR is currently supratherapeutic. 10.Coumadin 4.5 mg one tablet p.o. on Mondays and Fridays.  Again,     this is currently on hold as the patient's INR is supratherapeutic.     Please see below for further details. 11.Flexeril 5 mg one tablet 3 times a day p.r.n. 12.Ditropan XL 10 mg one tablet p.o. every morning. 13.Flonase one spray nasally twice daily. 14.Multivitamins one tablet p.o. daily. 15.Nexium 40 mg one capsule daily. 16.Tylenol 625 mg one tablet 3 times a day. 17.Vitamin B12 one injection subcutaneously  monthly. 18.Zocor 40 mg one tablet p.o. daily at bedtime.  FOLLOW-UP INSTRUCTIONS: 1. The patient will need follow up with her primary care practitioner,     Dr. Benedetto Goad within 1-2 weeks upon discharge from the skilled     nursing facility. 2. The patient will need follow up with Dr. Terrace Arabia from [facility]     Neurology within 1-2 weeks from discharge from the skilled nursing     facility. 3. The patient's INR is currently supratherapeutic and currently her     Coumadin is on hold and will need daily INR monitoring before her     Coumadin can be resumed.  Goal INR is between 2 and 3. 4. Disposition is to the skilled nursing facility once bed is     available.  Please note that we are currently awaiting a bed to be available at the skilled nursing facility.  If there are any changes to the patient's discharge medications or the patient's hospital course, then an addendum to this dictation will be dictated by the discharging physician.     Jeoffrey Massed, MD     SG/MEDQ  D:  04/28/2011  T:  04/28/2011  Job:  [id number]  cc:   [name], M.D. Levert Feinstein, MD  Electronically Signed by Jeoffrey Massed  on 05/03/2011 11:37:37 AM

## 2011-05-03 NOTE — Discharge Summary (Signed)
[name], Black NO.:  [id number]  MEDICAL RECORD NO.:  [id number]  LOCATION:  3035                         FACILITY:  [facility]  PHYSICIAN:  Jeoffrey Massed, MD    DATE OF BIRTH:  10-Feb-1934  DATE OF ADMISSION:  04/24/2011 DATE OF DISCHARGE:  04/27/2011                        DISCHARGE SUMMARY - REFERRING   PRIMARY CARE PRACTITIONER:  Charlene Black. Charlene Campanile, MD  PRIMARY NEUROLOGIST:  Charlene Feinstein, MD of [facility].  PRIMARY DISCHARGE DIAGNOSES: 1. Right body pain probably secondary to underlying neuropathy and     myelopathy.  Workup so far negative. 2. Right-sided headache significantly better.  MRI of the brain     negative.  SECONDARY DISCHARGE DIAGNOSES/ PAST MEDICAL HISTORY: 1. Atrial fibrillation, on chronic Coumadin therapy. 2. Known peripheral neuropathy. 3. Known history of myelopathy and thoracolumbar subarachnoid cyst,     which has been told to the patient to be nonoperable done by     numerous neuro surgeons in [place], [place] as well as by [employer]. 4. Chronic constipation. 5. Dyslipidemia. 6. Chronic urinary retention, on Ditropan.  DISCHARGE MEDICATIONS:  Include the following, 1. Senokot 2 tablets p.o. twice daily. 2. Ultram 50 mg 1 tablet three times a day. 3. MiraLax 17 grams p.o. twice daily. 4. Alphagan 1 drop in both eyes twice daily. 5. Aspirin 81 mg 1 tablet daily. 6. Colace 100 mg 1 capsule p.o. every evening. 7. Coumadin 3 mg on Tuesdays, Wednesdays, Thursdays, Saturdays, and     Sundays and 4.5 mg on Mondays and Fridays. 8. Flexeril 5 mg 1 tablet p.o. three times a day p.r.n. 9. Ditropan XL 10 mg 1 tablet p.o. every morning. 10.Flonase 1 spray nasally twice daily. 11.Multivitamins 1 tablet p.o. daily. 12.Neurontin 400 mg 1 tablet three times a day. 13.Nexium 40 mg 1 capsule daily. 14.Tylenol 625 mg 1 tablet p.o. three times a day. 15.Vitamin B12 one injection subcutaneously monthly. 16.Zocor 40 mg 1  tablet p.o. at bedtime.  CONSULTATIONS:  Dr. Thana Black from Neurology.  HISTORY OF PRESENT ILLNESS:  The patient is a very pleasant 77 year old female, who unfortunately had the above-noted medical issues, was brought to the ED for right-sided headache and right body pain of several days' duration.  Apparently, home health physical therapist thought she had more weakness on her right side and was then referred to the ED for further evaluation.  Hence, the hospitalist service was contacted for admission and further management.  For further details, please see the history and physical that was dictated for Dr. Lovell Charlene Black on admission.  PERTINENT RADIOLOGICAL STUDIES: 1. CT of the head done on April 24, 2011, shows no intracranial trauma. 2. CT of the cervical spine shows no evidence of cervical spine     fracture. 3. MRI of the brain showed no acute intracranial abnormality.  Atrophy     and white matter disease has advanced for age.  This likely     reflects a sequelae of chronic microvascular ischemia. 4. MRA of the brain showed extensive small vessel disease.  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  This may be     intradural. 5. MRI of cervical spine showed solid interbody fusion from C4-C7.     There is no significant residual spinal stenosis and nerve root     encroachment at diffuse levels.  This had degenerative changes at     C2-C3 and C3-C4, contribute to moderate foraminal stenosis,     especially on the right side at C2-C3. 6. MRI of the lumbar spine without contrast showed stable distal     appearance of the large thoracolumbar arachnoid cyst.  In the     lumbar region, there is marked posterior displacement of the conus     medullaris and lumbosacral nerve roots.  Ectasia of the S1 nerve     root sleeves, anterior displacement of the S1 nerve root, and     underlying fluid in  the caudal tape of the arachnoid cyst     unchanged.  PERTINENT LABORATORY DATA: 1. ANA is negative. 2. ESR 16. 3. INR on discharge 3.02. 4. LDL cholesterol 63. 5. HbA1c of 5.6. 6. TSH 0.662.  BRIEF HOSPITAL COURSE: 1. Generalized right-sided pain including right-sided headache, right     body pain along with some questionable right-sided weakness.  The     patient was admitted.  Initial CT of the head and the CT of     cervical spine were negative.  MRI of the brain was subsequently     done, which did not show any stroke or any acute CVA.  The MRA of     the brain was also done and findings are noted as above.  Neurology     was then consulted, who thought that the patient had very     inconsistent exam.  The patient has a known history of having     peripheral neuropathy and myelopathy.  She also has chronic     thoracolumbar arachnoid cyst.  The patient claims that she has been     evaluated by numerous neurosurgeons over the past number of years     at [place], [place] who have told her that this is inoperable.     Upon moving here to [facility], few months to a year back, she     apparently has also been evaluated by [facility], who had     thought that some of the patient's symptoms can be attributable to     the arachnoid cyst, and they have also told her that this is     inoperable.  Neurology has followed up this patient and today have     actually cleared the patient for discharge.  They have not     suggested a further evaluation.  They have suggested that perhaps     follow up with Neurosurgery as an outpatient.  Since the patient     has had numerous neurosurgical evaluations in the past, we will     defer this to her primary neurologist or her primary care     practitioner.  I also did speak with Dr. Terrace Arabia from [facility]     Neurology over the phone and did explain to her that the patient's     presenting symptoms.  Dr. Terrace Arabia also did confirm to me that per  her     notes that this patient did in fact was evaluated by a [facility]     [facility] and has been told that this is inoperable.  Dr. Terrace Arabia     has also performed an EMG on her.  Dr. Terrace Arabia did recommend that the     patient follow up with her on an urgent basis as well.  I have also     placed a call to the patient's primary care practitioner, Dr. Benedetto Goad and currently I am awaiting call back from his office as     well.  At this time, it seems that most of these issues that the     patient has is unfortunately chronic and has been evaluated by     numerous physicians in the past and apparently it seems that there     are no good options at this point in time.  The patient is     understandably frustrated with this as it has started to effect her     quality of life to the extent where she is not able to ambulate     properly and to the extent where she has developed chronic urinary     incontinence.  Unfortunately, we also have no further good options     and at this time, I have asked her to follow up with her usual     physicians for further continued care.  I have tried to get in     touch with the patient's daughter, Ms. Jamey Reas, and I have     left her a message on her answering machine as well.  My current     plans are to discharge this patient later today and to have a     follow up with Dr. Andrey Black and Dr. Terrace Arabia as an outpatient.  As noted     above since she has had numerous neurosurgical evaluations in both     [facility] and on [facility], at this time, I am     deferring a neurosurgical evaluation during this hospitalization     and we will defer the need for one if any to her primary care     practitioner and her primary neurologist. 2. Constipation.  Review of the patient's medical list from home does     indicate that the patient is on MiraLax and senna and the patient     does claim that she is occasionally constipated.  I think most of     her  constipation may be coming from the narcotic medications as she     has received here and also of the fact that the patient is also on     tramadol as an outpatient.  I am going to change her MiraLax to     twice daily and place her on Senokot 2 tablets twice daily.  We     will give her a Dulcolax suppository prior to discharge.  The     patient has been instructed to consume fluids and be on a green     leafy high-fiber diet as well.  She claims understanding. 3. Peripheral neuropathy and myelopathy.  The patient is on chronic     Neurontin and tramadol therapy for this.  She follows up with Dr.     Terrace Arabia from [facility] and is asked to make an appointment in     the next few weeks upon discharge from the hospital for further     continued optimization of her medications and for further continued     care. 4. Atrial fibrillation, on chronic Coumadin  therapy.  She is     maintained on Coumadin and will continue usual dosing of Coumadin     and follow up with her primary care practitioner for further needs. 5. Dyslipidemia.  She is to continue statin. 6. Chronic urinary retention.  She is maintained on Ditropan.  DISPOSITION:  Unfortunately, this patient has met maximal benefit from her inpatient hospital stay.  Most of her medical issues seemed to be of chronic in nature and will be discharged home later today for further continued outpatient follow up and care with her primary doctors.  FOLLOWUP INSTRUCTIONS: 1. The patient to follow up with Dr. Benedetto Goad within 1-2 weeks upon     discharge, she is to call and make an appointment. 2. The patient to follow up with Dr. Terrace Arabia from [facility]     within 1-2 weeks upon discharge, she is to     call and make an appointment.. 3. Case management worker will arrange for home health PT/OT, speech     therapy, and also an RN on discharge.  Total time spent equals 65 minutes.     Jeoffrey Massed, MD     SG/MEDQ  D:   04/27/2011  T:  04/27/2011  Job:  [id number]  cc:   Charlene Black. Charlene Black, M.D. Charlene Feinstein, MD  Electronically Signed by Jeoffrey Massed  on 05/03/2011 11:35:57 AM

## 2011-05-06 NOTE — Consult Note (Signed)
NAMEARCOLA, FRESHOUR NO.:  [id number]  MEDICAL RECORD NO.:  [id number]  LOCATION:  3035                         FACILITY:  [facility]  PHYSICIAN:  Thana Farr, MD    DATE OF BIRTH:  08-20-1934  DATE OF CONSULTATION:  04/25/2011 DATE OF DISCHARGE:                                CONSULTATION   CONSULTING PHYSICIAN:  Thana Farr, MD  CHIEF COMPLAINT:  Headache, right-sided pain and weakness.  HISTORY OF PRESENT ILLNESS:  This is a 77 year old obese right-hand- dominant white female with known chronic atrial fibrillation on Coumadin anticoagulation, hypertension.  She also has diet-controlled diabetes and peripheral neuropathy.  The patient was admitted secondary to intractable right posterior ocular headache and right-sided pain and weakness.  On admission, her INR was 2.29 and head CT was negative for bleed, infarction, or obvious mass. Vital signs were stable with rate-controlled AFib.  Neurology consult was called to evaluate upper neurologic etiology to her presenting symptoms.  PAST MEDICAL HISTORY: 1. AFib, on Coumadin anticoagulation which is therapeutic. 2. Hypertension. 3. History of DVT. 4. History of prolapse. 5. History of Marfan syndrome. 6. Peripheral neuropathy. 7. IBS. 8. Pernicious anemia. 9. Osteoarthritis. 10.Hyperlipidemia. 11.GERD. 12.History of urinary incontinence. 13.History of glaucoma and early macular degeneration OU. 14.History of appendectomy and hysterectomy.  STROKE RISK FACTORS:  Include obesity, hyperlipidemia, diabetes, sedentary lifestyle, hypertension, atrial fibrillation.  MEDICATIONS: 1. Aspirin 325 a day. 2. Neurontin 1200 mg t.i.d. 3. Zocor 40 mg a day. 4. Coumadin as directed. 5. Protonix 80 mg b.i.d. 6. Ditropan 10 mg. 7. MiraLax and Senokot. 8. Alphagan drops as directed. 9. Flonase. 10.Tramadol 50 mg b.i.d.  ALLERGIES: 1. SULFA. 2. AMPICILLIN. 3. CEPHALOSPORIN. 4. GENTAMICIN. 5.  CODEINE. 6. TETRACYCLINE. 7. TMP-SMX. 8. PREDNISONE. 9. DIPHENHYDRAMINE. 10.TOBRAMYCIN.  FAMILY HISTORY:  Noncontributory in this consult.  SOCIAL HISTORY:  Lives with daughter.  Denies tobacco, alcohol or illicit drug use.  She was living independently 1 year ago.  Because of recurrent falls, the patient was unable to care for herself and has been living with her daughter.  She does use a walker for ambulation.  REVIEW OF SYSTEMS:  RESPIRATORY:  She denies shortness of breath. MUSCULOSKELETAL:  Complains of chronic low back pain with right-sided sciatica.  GI:  Has irritable bowel syndrome and bowel incontinence x7-8 years.  GENITOURINARY:  She complains of bladder incontinence which is chronic, but gotten worse recently.  She has diet-controlled diabetes. CARDIOVASCULAR:  She has history of Marfan syndrome, mitral valve prolapse.  She has a history of atrial fibrillation, but is rate controlled.  HEMATOLOGIC:  History of pernicious anemia and DVT.  PHYSICAL EXAMINATION:  VITAL SIGNS:  Temperature is 97.6, blood pressure 107/65, heart rate 66, respirations 19, SAO2 95% on room air. HEENT:  Pupils are irregular and reactivity to light is difficult to assess.  She is tender to palpation at the right temporal region and right SCM. SKIN:  Exam of skin and mucosa shows atrophic changes, especially lower extremities. NEUROLOGIC:  Follows multi-step commands.  Alert and oriented x3. Pupils are irregular.  EOMI.  Visual fields difficult to assess (has known glaucoma and macular degeneration OU) and baseline decreased vision.  Tongue is midline.  Face is symmetric.  Grip seems to be weaker on the right than the left, and strength in the upper and lower extremities seems to be slightly weaker on the right -5/5, and 5/5 on the left.  She has full range of motion. No drift is noted.  If enouraged she can give full strength. Rapid alternating movements are intact.  Sensation is diminished  bilaterally right greater than left; however, she she splits the midline.  DTRs are 2+ with plantars equivocal. Finfer-to-nose and heel-to-shin intact bilaterally.  TEST RESULTS:  Sodium 143, potassium 4.9, BUN 16, creatinine 0.54.  WBC 4.5, hemoglobin 11.8, platelets 190.  Total cholesterol 123, triglycerides 115, HDL 47, and LDL 63.  A1c is 5.6.  MRI of the brain without contrast media April 25, 2011, shows no acute abnormality.  She does have atrophy and white matter disease which is slightly advanced for her age.    MRA of the brain shows small vessel disease and approximate 60% vertebrobasilar artery stenosis.  ASSESSMENT AND PLAN:  This is a 77 year old white female who is right- hand dominant with several-day history of right-sided headache, possible vision changes in the right eye and subjective increased weakness and pain on the right side.  MRI and MRA of the brain is nonacute.  There is no mass or bleed, no infarct.  Doubt acute neurologic process.  Consider checking ANA and sed rate to evaluate possible arteritis.  May have a migraine variant.  Imbalance is likely due to visual deficit and peripheral neuropathy.  We will follow along.  Thank you for allowing [place] to assist in the management of this patient.    Luan Moore, P.A.   ______________________________ Thana Farr, MD    TCJ/MEDQ  D:  04/25/2011  T:  04/26/2011  Job:  [id number]  Electronically Signed by Delice Bison JERNEJCIC P.A. on 05/02/2011 11:08:00 AM Electronically Signed by Thana Farr MD on 05/06/2011 12:48:57 PM

## 2011-05-11 NOTE — H&P (Signed)
NAMEAUBRIEE, SZETO NO.:  [id number]  MEDICAL RECORD NO.:  [id number]  LOCATION:  3035                         FACILITY:  [facility]  PHYSICIAN:  Della Goo, M.D. DATE OF BIRTH:  1934-07-23  DATE OF ADMISSION:  04/24/2011 DATE OF DISCHARGE:                             HISTORY & PHYSICAL   DATE OF ADMISSION:  April 24, 2011.  PRIMARY CARE PHYSICIAN:  Dr. Benedetto Goad.  CHIEF COMPLAINT:  Headache, pain down right side.  HISTORY OF PRESENT ILLNESS:  This is a 77 year old female who was brought to the emergency department secondary to complaints of severe headache for the past several days, behind the right eye which has been worsening associated with visual changes.  The patient states she began to have pain down the right side of her body as well during the past 24 hours.  She denies having any numbness.  She also reports having pain in her right leg in the thigh and in her calf.  The patient was seen and evaluated by the emergency department physician and referred for medical admission secondary to concerns of a possible DVT and concerns of pathology causing the severe intractable headache.  The patient did complain of having chest pain.  The patient had complaints as well of chest pain intermittently for the past 2 weeks.  PAST MEDICAL HISTORY:  Significant for hypertension, atrial fibrillation, osteoarthritis, pernicious anemia, mitral valve prolapse, Marfan syndrome, arachnoid cyst of the spine which are inoperable, neuropathy, irritable bowel syndrome, previous history of deep venous thrombosis, allergic rhinitis.  MEDICATIONS:  Include aspirin, Alphagan ophthalmic drops, Colace, Coumadin, Ditropan, Flonase, MiraLax, multivitamin, promethazine VC, senna, Nexium, Neurontin, simvastatin, tramadol, and Tylenol.  PAST SURGICAL HISTORY:  History of anterior cervical diskectomy, hysterectomy, appendectomy, bilateral cataract surgeries, basal  cell carcinoma excision on the nasal tip.  ALLERGIES:  AMIKACIN, BENADRYL, CEFAZOLIN, CEFOXITIN, GENTAMICIN, MANDELAMINE, NALIDIXIC ACID, NITROFURANTOIN, PENICILLIN.  SOCIAL HISTORY:  The patient lives with her daughter.  She is a nonsmoker, nondrinker.  No history of illicit drug usage.  FAMILY HISTORY:  Positive for coronary artery disease in her father. Mother had congestive heart failure syndrome.  Father had leukemia and her mother had liver cancer.  REVIEW OF SYSTEMS:  Pertinent's mentioned above.  PHYSICAL EXAMINATION FINDINGS:  GENERAL:  This is a morbidly obese elderly 77 year old Caucasian female who is in no acute distress. VITAL SIGNS:  Temperature 98.0, blood pressure 102/60, heart rate 78, respirations 16, O2 sats 95%. HEENT:  Normocephalic, atraumatic.  Pupils equally round reactive to light.  Extraocular movements are intact, funduscopic benign.  There is no scleral icterus.  Nares are patent bilaterally.  Oropharynx is clear. NECK:  Supple full range of motion.  No thyromegaly, adenopathy, jugular venous distention. CARDIOVASCULAR:  Irregular rate and rhythm.  Corrected to a regular rate and rhythm. LUNGS:  Clear to auscultation bilaterally.  No rales, rhonchi, or wheezes. ABDOMEN:  Positive bowel sounds, soft, nontender, nondistended.  No hepatosplenomegaly. EXTREMITIES:  Without cyanosis, clubbing, or edema.  There is no Homans' sign.  The calf and thigh are nontender to palpation.  There are no palpable venous cords. NEUROLOGIC:  Examination nonfocal.  LABORATORY STUDIES:  White blood  cell count 5.6, hemoglobin 11.9, hematocrit 35.2, platelets 150, neutrophils 63% lymphocytes 25%.  Sodium 133, potassium 3.0, chloride 99, CO2 21, BUN 14, creatinine 1.32, glucose 119.  Protime 25.6, INR 2.29, PTT 42.  Cardiac enzymes with total CK of 107, CK-MB 1.4, relative index 1.3, troponin less than 0.30. Chest x-ray reveals subtle opacification of the right upper lobe.   CT scan of the head revealing no acute intracranial hemorrhage or signs of infarction.  No changes from previous CT scan of the head.  CT scan of the C-spine negative for any fractures or acute findings.  EKG reveals an atrial fibrillation, ventricular rate of 73.  No acute ST-segment changes were seen.  ASSESSMENT:  A 77 year old female being admitted with: 1. Right-sided neuralgia. 2. Intractable headache. 3. Right leg pain. 4. Chest pain. 5. Atrial fibrillation on Coumadin therapy. 6. Hypokalemia. 7. Hypertension. 8. Coagulopathy secondary to Coumadin therapy. 9. Mild anemia.  PLAN:  The patient will be admitted to 23-hour observation to telemetry area.  A neurologic workup will be initiated.  The patient will have an MRI, MRA study of the brain in the a.m.Marland Kitchen  Also a venous ultrasound of the right lower extremity has been ordered to evaluate for possible DVT. However, please note the patient is therapeutic on Coumadin at this time, which makes this less likely, but still possible.  The patient's regular medications will be further reconciled and further workup will ensue pending results of the patient's clinical course.  Cardiac enzymes are being performed as well.     Della Goo, M.D.     HJ/MEDQ  D:  04/25/2011  T:  04/25/2011  Job:  [id number]  cc:   [name], M.D.  Electronically Signed by Della Goo M.D. on 05/11/2011 12:25:10 PM

## 2011-06-08 ENCOUNTER — Emergency Department (HOSPITAL_COMMUNITY): Payer: Medicare Other

## 2011-06-08 ENCOUNTER — Emergency Department (HOSPITAL_COMMUNITY)
Admission: EM | Admit: 2011-06-08 | Discharge: 2011-06-09 | Disposition: A | Discharge status: Home / Self Care | Payer: Medicare Other | Attending: Emergency Medicine | Admitting: Emergency Medicine

## 2011-06-08 DIAGNOSIS — E041 Nontoxic single thyroid nodule: Secondary | ICD-10-CM | POA: Insufficient documentation

## 2011-06-08 DIAGNOSIS — K589 Irritable bowel syndrome without diarrhea: Secondary | ICD-10-CM | POA: Insufficient documentation

## 2011-06-08 DIAGNOSIS — W010XXA Fall on same level from slipping, tripping and stumbling without subsequent striking against object, initial encounter: Secondary | ICD-10-CM | POA: Insufficient documentation

## 2011-06-08 DIAGNOSIS — E785 Hyperlipidemia, unspecified: Secondary | ICD-10-CM | POA: Insufficient documentation

## 2011-06-08 DIAGNOSIS — S82899A Other fracture of unspecified lower leg, initial encounter for closed fracture: Secondary | ICD-10-CM | POA: Insufficient documentation

## 2011-06-08 DIAGNOSIS — S0990XA Unspecified injury of head, initial encounter: Secondary | ICD-10-CM | POA: Insufficient documentation

## 2011-06-08 DIAGNOSIS — M79609 Pain in unspecified limb: Secondary | ICD-10-CM | POA: Insufficient documentation

## 2011-06-08 DIAGNOSIS — R51 Headache: Secondary | ICD-10-CM | POA: Insufficient documentation

## 2011-06-08 DIAGNOSIS — I4891 Unspecified atrial fibrillation: Secondary | ICD-10-CM | POA: Insufficient documentation

## 2011-06-08 DIAGNOSIS — I10 Essential (primary) hypertension: Secondary | ICD-10-CM | POA: Insufficient documentation

## 2011-06-08 DIAGNOSIS — M25579 Pain in unspecified ankle and joints of unspecified foot: Secondary | ICD-10-CM | POA: Insufficient documentation

## 2011-06-08 DIAGNOSIS — Y92009 Unspecified place in unspecified non-institutional (private) residence as the place of occurrence of the external cause: Secondary | ICD-10-CM | POA: Insufficient documentation

## 2011-06-18 ENCOUNTER — Encounter: Payer: Medicare Other | Admitting: Hematology and Oncology

## 2011-06-24 ENCOUNTER — Other Ambulatory Visit: Payer: Self-pay | Admitting: Hematology and Oncology

## 2011-06-24 ENCOUNTER — Encounter (HOSPITAL_BASED_OUTPATIENT_CLINIC_OR_DEPARTMENT_OTHER): Payer: Medicare Other | Admitting: Hematology and Oncology

## 2011-06-24 DIAGNOSIS — C160 Malignant neoplasm of cardia: Secondary | ICD-10-CM

## 2011-06-24 DIAGNOSIS — C169 Malignant neoplasm of stomach, unspecified: Secondary | ICD-10-CM

## 2011-06-24 DIAGNOSIS — E538 Deficiency of other specified B group vitamins: Secondary | ICD-10-CM

## 2011-06-24 LAB — CEA: CEA: 1.2 ng/mL (ref 0.0–5.0)

## 2011-06-24 LAB — COMPREHENSIVE METABOLIC PANEL
Albumin: 4.2 g/dL (ref 3.5–5.2)
BUN: 15 mg/dL (ref 6–23)
CO2: 25 mEq/L (ref 19–32)
Calcium: 9.5 mg/dL (ref 8.4–10.5)
Chloride: 100 mEq/L (ref 96–112)
Glucose, Bld: 109 mg/dL — ABNORMAL HIGH (ref 70–99)
Potassium: 4.5 mEq/L (ref 3.5–5.3)

## 2011-06-24 LAB — CBC WITH DIFFERENTIAL/PLATELET
Basophils Absolute: 0 10*3/uL (ref 0.0–0.1)
Eosinophils Absolute: 0.1 10*3/uL (ref 0.0–0.5)
HCT: 37.6 % (ref 34.8–46.6)
HGB: 12.3 g/dL (ref 11.6–15.9)
NEUT#: 4.6 10*3/uL (ref 1.5–6.5)
NEUT%: 74 % (ref 38.4–76.8)
RDW: 15.3 % — ABNORMAL HIGH (ref 11.2–14.5)
lymph#: 1.1 10*3/uL (ref 0.9–3.3)

## 2011-06-24 LAB — VITAMIN B12: Vitamin B-12: 643 pg/mL (ref 211–911)

## 2011-06-24 LAB — TSH: TSH: 0.448 u[IU]/mL (ref 0.350–4.500)

## 2011-06-26 ENCOUNTER — Other Ambulatory Visit: Payer: Self-pay | Admitting: Hematology and Oncology

## 2011-06-26 DIAGNOSIS — C169 Malignant neoplasm of stomach, unspecified: Secondary | ICD-10-CM

## 2011-06-30 ENCOUNTER — Encounter (INDEPENDENT_AMBULATORY_CARE_PROVIDER_SITE_OTHER): Payer: Self-pay | Admitting: Surgery

## 2011-06-30 ENCOUNTER — Ambulatory Visit (INDEPENDENT_AMBULATORY_CARE_PROVIDER_SITE_OTHER): Payer: Medicare Other | Admitting: Surgery

## 2011-06-30 VITALS — BP 108/64 | HR 92 | Temp 97.8°F | Ht 69.0 in | Wt 182.0 lb

## 2011-06-30 DIAGNOSIS — C169 Malignant neoplasm of stomach, unspecified: Secondary | ICD-10-CM

## 2011-06-30 NOTE — Progress Notes (Signed)
Chief Complaint  Patient presents with  . Other    new pt- gastric ca    HPI Charlene Black is a 77 y.o. female.  HPI This is a 77 year old female who presents in referral from Dr. Dalene Carrow for evaluation of gastric cancer. This patient has a very extensive past medical history of presents with a 14 month history of worsening dysphagia and early satiety. She has had a decrease in her appetite but no significant weight loss. She describes some pain in her upper abdomen which seems to get worse after eating. She denies any nausea or vomiting. Bowel movements have been irregular with constipation alternating with diarrhea. She denies any gross rectal bleeding. She was referred to Dr. Vashti Hey and high 0.4 upper endoscopy. There was some thickening of the mucosa in the distal esophagus with a moderate stricture at the GE junction from an external mass. In the cardia a 3 cm ulcerated necrotic mass was noted abutting the GE junction. Biopsies were taken. We did not have the biopsy report from Dr. Lonell Face note this was a moderately to poorly differentiated adenocarcinoma. The patient is here to discuss possible surgical treatment options.  Past Medical History  Diagnosis Date  . Anemia   . Arthritis   . Blood transfusion   . Clotting disorder   . Heart murmur   . GERD (gastroesophageal reflux disease)   . Diabetes mellitus   . Neuromuscular disorder     neuropathy  . Osteoporosis   . Stroke   . Cancer     gastric, nose  . Marfan syndrome     Past Surgical History  Procedure Date  . Abdominal hysterectomy 1972  . Anterior cervical discectomy 1985  . Nose surgery     due to cancer  . Eye surgery     rt x4  . Cataract extraction     History reviewed. No pertinent family history.  Social History History  Substance Use Topics  . Smoking status: Never Smoker   . Smokeless tobacco: Not on file  . Alcohol Use: No    Allergies  Allergen Reactions  . Acetaminophen   .  Amikacin   . Ampicillin   . Cefazolin   . Cefoxitin   . Cephalosporins   . Codeine Sulfate   . Diphenhydramine Hcl   . Gentamicin   . Methenamine Mandelate   . Morphine And Related   . Nalidixic Acid   . Nitrofuran Derivatives   . Prednisolone   . Sulfa Drugs Cross Reactors   . Sulfamethoxazole   . Sulfamethoxazole-Trimethoprim   . Tetracyclines & Related   . Tobramycin Sulfate   . Tobramycin-Dexamethasone     Current Outpatient Prescriptions  Medication Sig Dispense Refill  . aspirin 81 MG tablet Take 81 mg by mouth daily.        . brimonidine (ALPHAGAN) 0.15 % ophthalmic solution 1 drop 3 (three) times daily.        . calcium & magnesium carbonates (MYLANTA) 311-232 MG per tablet Take 1 tablet by mouth daily.        . Camphor-Menthol (MEN-PHOR EX) Apply topically.        . cyclobenzaprine (FLEXERIL) 5 MG tablet Take 5 mg by mouth 3 (three) times daily as needed.        . CYMBALTA 20 MG capsule       . docusate sodium (COLACE) 100 MG capsule Take 100 mg by mouth 2 (two) times daily.        Marland Kitchen  fluticasone (FLONASE) 50 MCG/ACT nasal spray       . gabapentin (NEURONTIN) 600 MG tablet       . HYDROcodone-acetaminophen (VICODIN) 5-500 MG per tablet       . Multiple Vitamin (MULTIVITAMIN) capsule Take 1 capsule by mouth daily.        Marland Kitchen NEXIUM 40 MG capsule       . niacin 250 MG tablet Take 250 mg by mouth daily with breakfast.        . oxybutynin (DITROPAN-XL) 10 MG 24 hr tablet       . polyethylene glycol (MIRALAX / GLYCOLAX) packet Take 17 g by mouth daily.        . traMADol (ULTRAM) 50 MG tablet Take 50 mg by mouth every 6 (six) hours as needed.        . warfarin (COUMADIN) 3 MG tablet         Review of Systems Review of Systems  Blood pressure 108/64, pulse 92, temperature 97.8 F (36.6 C), temperature source Temporal, height 5\' 9"  (1.753 m), weight 182 lb (82.555 kg). The patient has a fairly limited activity level. She recently broke her ankle in a fall. Even before  that time she had limited mobility require the use of a walker. Currently she is residing in a nursing facility while her ankle heals. She hopes to return home soon.  ROS positive for nasal congestion, dysphasia, voice change, chronic visual disturbances, cough, chest pain, leg swelling, heart palpitations, upper abdominal pain, constipation and diarrhea, nausea, headaches, weakness, easy bruising.  Physical Exam Physical Exam WDWN in NAD - elderly, wheel-chair bound HEENT:  EOMI, sclera anicteric Neck:  No masses, no thyromegaly Lungs:  CTA bilaterally; normal respiratory effort CV:  Regular rate and rhythm; mild systolic murmur Abd:  +bowel sounds, soft, mildly tender in LUQ, no masses Ext:  Well-perfused; no edema Skin:  Warm, dry; no sign of jaundice Data Reviewed EGD report from Dr. 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    Plan    Surgical resection would require possible resection of the distal esophagus and proximal stomach with a primary anastomosis. The patient is a very poor surgical candidate with extensive medical comorbidities. I spent a considerable amount of time with the patient and her daughter discussing possible surgery and the associated risks. I believe the patient would be prohibitively high risk for this extensive surgery. Dr. Dalene Carrow feels that another alternative would be radiation therapy with weekly Taxol and carboplatin. She is scheduled for a colonoscopy next week to evaluate her irregular bowel movements. She also has a CT/PET scan scheduled. She is meeting with Dr. Basilio Cairo of radiation oncology tomorrow. She is also scheduled for port placement by interventional radiology next week. I am in agreement with this plan. Will be glad to see her back on a p.r.n. basis as needed. I did discuss with the patient that she possibly could develop a  significant upper GI bleed which might require emergent surgery. If this were the case, she would likely due fairly poorly during the recovery period.       Charlene Black K. 06/30/2011, 1:15 PM

## 2011-07-01 ENCOUNTER — Ambulatory Visit
Admission: RE | Admit: 2011-07-01 | Discharge: 2011-07-01 | Disposition: A | Discharge status: Home / Self Care | Payer: Medicare Other | Source: Ambulatory Visit | Attending: Radiation Oncology | Admitting: Radiation Oncology

## 2011-07-01 DIAGNOSIS — K222 Esophageal obstruction: Secondary | ICD-10-CM | POA: Insufficient documentation

## 2011-07-01 DIAGNOSIS — C160 Malignant neoplasm of cardia: Secondary | ICD-10-CM | POA: Insufficient documentation

## 2011-07-01 DIAGNOSIS — E538 Deficiency of other specified B group vitamins: Secondary | ICD-10-CM | POA: Insufficient documentation

## 2011-07-01 DIAGNOSIS — R5381 Other malaise: Secondary | ICD-10-CM | POA: Insufficient documentation

## 2011-07-01 DIAGNOSIS — K219 Gastro-esophageal reflux disease without esophagitis: Secondary | ICD-10-CM | POA: Insufficient documentation

## 2011-07-01 DIAGNOSIS — Q8740 Marfan's syndrome, unspecified: Secondary | ICD-10-CM | POA: Insufficient documentation

## 2011-07-01 DIAGNOSIS — R112 Nausea with vomiting, unspecified: Secondary | ICD-10-CM | POA: Insufficient documentation

## 2011-07-01 DIAGNOSIS — Z79899 Other long term (current) drug therapy: Secondary | ICD-10-CM | POA: Insufficient documentation

## 2011-07-01 DIAGNOSIS — Z8673 Personal history of transient ischemic attack (TIA), and cerebral infarction without residual deficits: Secondary | ICD-10-CM | POA: Insufficient documentation

## 2011-07-01 DIAGNOSIS — Z510 Encounter for antineoplastic radiation therapy: Secondary | ICD-10-CM | POA: Insufficient documentation

## 2011-07-01 DIAGNOSIS — K209 Esophagitis, unspecified without bleeding: Secondary | ICD-10-CM | POA: Insufficient documentation

## 2011-07-01 DIAGNOSIS — E119 Type 2 diabetes mellitus without complications: Secondary | ICD-10-CM | POA: Insufficient documentation

## 2011-07-01 DIAGNOSIS — R1310 Dysphagia, unspecified: Secondary | ICD-10-CM | POA: Insufficient documentation

## 2011-07-01 DIAGNOSIS — E860 Dehydration: Secondary | ICD-10-CM | POA: Insufficient documentation

## 2011-07-01 DIAGNOSIS — I4891 Unspecified atrial fibrillation: Secondary | ICD-10-CM | POA: Insufficient documentation

## 2011-07-01 DIAGNOSIS — R5383 Other fatigue: Secondary | ICD-10-CM | POA: Insufficient documentation

## 2011-07-01 DIAGNOSIS — Z8559 Personal history of malignant neoplasm of other urinary tract organ: Secondary | ICD-10-CM | POA: Insufficient documentation

## 2011-07-01 DIAGNOSIS — R040 Epistaxis: Secondary | ICD-10-CM | POA: Insufficient documentation

## 2011-07-01 DIAGNOSIS — B370 Candidal stomatitis: Secondary | ICD-10-CM | POA: Insufficient documentation

## 2011-07-01 DIAGNOSIS — Z7901 Long term (current) use of anticoagulants: Secondary | ICD-10-CM | POA: Insufficient documentation

## 2011-07-01 DIAGNOSIS — M810 Age-related osteoporosis without current pathological fracture: Secondary | ICD-10-CM | POA: Insufficient documentation

## 2011-07-01 DIAGNOSIS — K5900 Constipation, unspecified: Secondary | ICD-10-CM | POA: Insufficient documentation

## 2011-07-01 DIAGNOSIS — E7800 Pure hypercholesterolemia, unspecified: Secondary | ICD-10-CM | POA: Insufficient documentation

## 2011-07-02 ENCOUNTER — Other Ambulatory Visit (HOSPITAL_COMMUNITY): Payer: Medicare Other

## 2011-07-02 ENCOUNTER — Ambulatory Visit (HOSPITAL_COMMUNITY)
Admission: RE | Admit: 2011-07-02 | Discharge: 2011-07-02 | Disposition: A | Discharge status: Home / Self Care | Payer: Medicare Other | Source: Ambulatory Visit | Attending: Hematology and Oncology | Admitting: Hematology and Oncology

## 2011-07-02 ENCOUNTER — Other Ambulatory Visit: Payer: Self-pay | Admitting: Hematology and Oncology

## 2011-07-02 DIAGNOSIS — I4891 Unspecified atrial fibrillation: Secondary | ICD-10-CM | POA: Insufficient documentation

## 2011-07-02 DIAGNOSIS — Z79899 Other long term (current) drug therapy: Secondary | ICD-10-CM | POA: Insufficient documentation

## 2011-07-02 DIAGNOSIS — Z8542 Personal history of malignant neoplasm of other parts of uterus: Secondary | ICD-10-CM | POA: Insufficient documentation

## 2011-07-02 DIAGNOSIS — E119 Type 2 diabetes mellitus without complications: Secondary | ICD-10-CM | POA: Insufficient documentation

## 2011-07-02 DIAGNOSIS — Z7901 Long term (current) use of anticoagulants: Secondary | ICD-10-CM | POA: Insufficient documentation

## 2011-07-02 DIAGNOSIS — Z8673 Personal history of transient ischemic attack (TIA), and cerebral infarction without residual deficits: Secondary | ICD-10-CM | POA: Insufficient documentation

## 2011-07-02 DIAGNOSIS — Z7982 Long term (current) use of aspirin: Secondary | ICD-10-CM | POA: Insufficient documentation

## 2011-07-02 DIAGNOSIS — E7800 Pure hypercholesterolemia, unspecified: Secondary | ICD-10-CM | POA: Insufficient documentation

## 2011-07-02 DIAGNOSIS — C169 Malignant neoplasm of stomach, unspecified: Secondary | ICD-10-CM

## 2011-07-03 ENCOUNTER — Other Ambulatory Visit (HOSPITAL_COMMUNITY): Payer: Medicare Other

## 2011-07-08 ENCOUNTER — Other Ambulatory Visit (HOSPITAL_COMMUNITY): Payer: Medicare Other

## 2011-07-08 ENCOUNTER — Encounter (INDEPENDENT_AMBULATORY_CARE_PROVIDER_SITE_OTHER): Payer: Self-pay | Admitting: General Surgery

## 2011-07-09 ENCOUNTER — Encounter (HOSPITAL_COMMUNITY): Payer: Self-pay

## 2011-07-09 ENCOUNTER — Encounter (HOSPITAL_COMMUNITY)
Admission: RE | Admit: 2011-07-09 | Discharge: 2011-07-09 | Disposition: A | Discharge status: Home / Self Care | Payer: Medicare Other | Source: Ambulatory Visit | Attending: Hematology and Oncology | Admitting: Hematology and Oncology

## 2011-07-09 DIAGNOSIS — C169 Malignant neoplasm of stomach, unspecified: Secondary | ICD-10-CM

## 2011-07-09 DIAGNOSIS — Z79899 Other long term (current) drug therapy: Secondary | ICD-10-CM | POA: Insufficient documentation

## 2011-07-09 DIAGNOSIS — Z90710 Acquired absence of both cervix and uterus: Secondary | ICD-10-CM | POA: Insufficient documentation

## 2011-07-09 DIAGNOSIS — R0602 Shortness of breath: Secondary | ICD-10-CM | POA: Insufficient documentation

## 2011-07-09 DIAGNOSIS — Z9089 Acquired absence of other organs: Secondary | ICD-10-CM | POA: Insufficient documentation

## 2011-07-09 DIAGNOSIS — R110 Nausea: Secondary | ICD-10-CM | POA: Insufficient documentation

## 2011-07-09 LAB — GLUCOSE, CAPILLARY: Glucose-Capillary: 93 mg/dL (ref 70–99)

## 2011-07-09 MED ORDER — IOHEXOL 300 MG/ML  SOLN
100.0000 mL | Freq: Once | INTRAMUSCULAR | Status: AC | PRN
  Administered 2011-07-09: 100 mL via INTRAVENOUS

## 2011-07-09 MED ORDER — FLUDEOXYGLUCOSE F - 18 (FDG) INJECTION
17.5000 | Freq: Once | INTRAVENOUS | Status: AC | PRN
  Administered 2011-07-09: 17.5 via INTRAVENOUS

## 2011-07-22 ENCOUNTER — Other Ambulatory Visit: Payer: Self-pay | Admitting: Hematology and Oncology

## 2011-07-22 ENCOUNTER — Encounter: Payer: Self-pay | Admitting: *Deleted

## 2011-07-22 ENCOUNTER — Encounter (HOSPITAL_BASED_OUTPATIENT_CLINIC_OR_DEPARTMENT_OTHER): Payer: Medicare Other | Admitting: Hematology and Oncology

## 2011-07-22 DIAGNOSIS — Z5111 Encounter for antineoplastic chemotherapy: Secondary | ICD-10-CM

## 2011-07-22 DIAGNOSIS — C160 Malignant neoplasm of cardia: Secondary | ICD-10-CM

## 2011-07-22 DIAGNOSIS — Z23 Encounter for immunization: Secondary | ICD-10-CM

## 2011-07-22 LAB — CBC WITH DIFFERENTIAL/PLATELET
Basophils Absolute: 0 10*3/uL (ref 0.0–0.1)
EOS%: 1.8 % (ref 0.0–7.0)
HCT: 38.7 % (ref 34.8–46.6)
HGB: 13 g/dL (ref 11.6–15.9)
MCH: 32.4 pg (ref 25.1–34.0)
MCV: 96.5 fL (ref 79.5–101.0)
NEUT%: 74.5 % (ref 38.4–76.8)
Platelets: 206 10*3/uL (ref 145–400)
lymph#: 1.2 10*3/uL (ref 0.9–3.3)

## 2011-07-22 LAB — BASIC METABOLIC PANEL
BUN: 27 mg/dL — ABNORMAL HIGH (ref 6–23)
CO2: 24 mEq/L (ref 19–32)
Chloride: 102 mEq/L (ref 96–112)
Creatinine, Ser: 0.68 mg/dL (ref 0.50–1.10)

## 2011-07-23 ENCOUNTER — Encounter: Payer: Self-pay | Admitting: *Deleted

## 2011-07-27 ENCOUNTER — Emergency Department (HOSPITAL_COMMUNITY)
Admission: EM | Admit: 2011-07-27 | Discharge: 2011-07-27 | Disposition: A | Discharge status: Home / Self Care | Payer: Medicare Other | Source: Home / Self Care | Attending: Emergency Medicine | Admitting: Emergency Medicine

## 2011-07-27 DIAGNOSIS — J029 Acute pharyngitis, unspecified: Secondary | ICD-10-CM | POA: Insufficient documentation

## 2011-07-27 DIAGNOSIS — I10 Essential (primary) hypertension: Secondary | ICD-10-CM | POA: Insufficient documentation

## 2011-07-27 DIAGNOSIS — Z79899 Other long term (current) drug therapy: Secondary | ICD-10-CM | POA: Insufficient documentation

## 2011-07-27 DIAGNOSIS — E785 Hyperlipidemia, unspecified: Secondary | ICD-10-CM | POA: Insufficient documentation

## 2011-07-27 DIAGNOSIS — R21 Rash and other nonspecific skin eruption: Secondary | ICD-10-CM | POA: Insufficient documentation

## 2011-07-27 DIAGNOSIS — I4891 Unspecified atrial fibrillation: Secondary | ICD-10-CM | POA: Insufficient documentation

## 2011-07-27 DIAGNOSIS — C169 Malignant neoplasm of stomach, unspecified: Secondary | ICD-10-CM | POA: Insufficient documentation

## 2011-07-27 LAB — RAPID STREP SCREEN (MED CTR MEBANE ONLY): Streptococcus, Group A Screen (Direct): NEGATIVE

## 2011-07-29 ENCOUNTER — Other Ambulatory Visit: Payer: Self-pay | Admitting: Hematology and Oncology

## 2011-07-29 ENCOUNTER — Encounter (HOSPITAL_BASED_OUTPATIENT_CLINIC_OR_DEPARTMENT_OTHER): Payer: Medicare Other | Admitting: Hematology and Oncology

## 2011-07-29 DIAGNOSIS — C160 Malignant neoplasm of cardia: Secondary | ICD-10-CM

## 2011-07-29 DIAGNOSIS — Z5111 Encounter for antineoplastic chemotherapy: Secondary | ICD-10-CM

## 2011-07-29 LAB — CBC WITH DIFFERENTIAL/PLATELET
BASO%: 0.1 % (ref 0.0–2.0)
EOS%: 0.8 % (ref 0.0–7.0)
LYMPH%: 7.8 % — ABNORMAL LOW (ref 14.0–49.7)
MCHC: 33.9 g/dL (ref 31.5–36.0)
MCV: 98.8 fL (ref 79.5–101.0)
MONO%: 3.9 % (ref 0.0–14.0)
Platelets: 160 10*3/uL (ref 145–400)
RBC: 3.82 10*6/uL (ref 3.70–5.45)
WBC: 6.1 10*3/uL (ref 3.9–10.3)

## 2011-07-29 LAB — BASIC METABOLIC PANEL
Calcium: 8.9 mg/dL (ref 8.4–10.5)
Sodium: 136 mEq/L (ref 135–145)

## 2011-07-30 ENCOUNTER — Inpatient Hospital Stay (HOSPITAL_COMMUNITY)
Admission: EM | Admit: 2011-07-30 | Discharge: 2011-07-31 | DRG: 151 | Disposition: A | Discharge status: Skilled Nursing Facility | Payer: Medicare Other | Attending: Internal Medicine | Admitting: Internal Medicine

## 2011-07-30 DIAGNOSIS — Z888 Allergy status to other drugs, medicaments and biological substances status: Secondary | ICD-10-CM

## 2011-07-30 DIAGNOSIS — Z86718 Personal history of other venous thrombosis and embolism: Secondary | ICD-10-CM

## 2011-07-30 DIAGNOSIS — F329 Major depressive disorder, single episode, unspecified: Secondary | ICD-10-CM | POA: Diagnosis present

## 2011-07-30 DIAGNOSIS — E785 Hyperlipidemia, unspecified: Secondary | ICD-10-CM | POA: Diagnosis present

## 2011-07-30 DIAGNOSIS — C55 Malignant neoplasm of uterus, part unspecified: Secondary | ICD-10-CM | POA: Diagnosis present

## 2011-07-30 DIAGNOSIS — Z882 Allergy status to sulfonamides status: Secondary | ICD-10-CM

## 2011-07-30 DIAGNOSIS — E039 Hypothyroidism, unspecified: Secondary | ICD-10-CM | POA: Diagnosis present

## 2011-07-30 DIAGNOSIS — Z8673 Personal history of transient ischemic attack (TIA), and cerebral infarction without residual deficits: Secondary | ICD-10-CM

## 2011-07-30 DIAGNOSIS — F3289 Other specified depressive episodes: Secondary | ICD-10-CM | POA: Diagnosis present

## 2011-07-30 DIAGNOSIS — G609 Hereditary and idiopathic neuropathy, unspecified: Secondary | ICD-10-CM | POA: Diagnosis present

## 2011-07-30 DIAGNOSIS — K219 Gastro-esophageal reflux disease without esophagitis: Secondary | ICD-10-CM | POA: Diagnosis present

## 2011-07-30 DIAGNOSIS — Z85828 Personal history of other malignant neoplasm of skin: Secondary | ICD-10-CM

## 2011-07-30 DIAGNOSIS — Q8740 Marfan's syndrome, unspecified: Secondary | ICD-10-CM

## 2011-07-30 DIAGNOSIS — Z85028 Personal history of other malignant neoplasm of stomach: Secondary | ICD-10-CM

## 2011-07-30 DIAGNOSIS — I4891 Unspecified atrial fibrillation: Secondary | ICD-10-CM | POA: Diagnosis present

## 2011-07-30 DIAGNOSIS — Z886 Allergy status to analgesic agent status: Secondary | ICD-10-CM

## 2011-07-30 DIAGNOSIS — R040 Epistaxis: Principal | ICD-10-CM | POA: Diagnosis present

## 2011-07-30 LAB — DIFFERENTIAL
Lymphocytes Relative: 4 % — ABNORMAL LOW (ref 12–46)
Lymphs Abs: 0.3 10*3/uL — ABNORMAL LOW (ref 0.7–4.0)
Neutrophils Relative %: 92 % — ABNORMAL HIGH (ref 43–77)

## 2011-07-30 LAB — CBC
HCT: 36.4 % (ref 36.0–46.0)
MCV: 95.3 fL (ref 78.0–100.0)
Platelets: 191 10*3/uL (ref 150–400)
RBC: 3.82 MIL/uL — ABNORMAL LOW (ref 3.87–5.11)
WBC: 7.6 10*3/uL (ref 4.0–10.5)

## 2011-07-30 LAB — PROTIME-INR: INR: 2.66 — ABNORMAL HIGH (ref 0.00–1.49)

## 2011-07-31 LAB — COMPREHENSIVE METABOLIC PANEL
ALT: 18 U/L (ref 0–35)
AST: 17 U/L (ref 0–37)
Albumin: 3.4 g/dL — ABNORMAL LOW (ref 3.5–5.2)
Alkaline Phosphatase: 53 U/L (ref 39–117)
CO2: 27 mEq/L (ref 19–32)
Chloride: 102 mEq/L (ref 96–112)
GFR calc non Af Amer: 84 mL/min — ABNORMAL LOW (ref >90)
Potassium: 4.3 mEq/L (ref 3.5–5.1)
Sodium: 137 mEq/L (ref 135–145)
Total Bilirubin: 0.4 mg/dL (ref 0.3–1.2)

## 2011-07-31 LAB — ABO/RH: ABO/RH(D): A POS

## 2011-07-31 LAB — URINALYSIS, ROUTINE W REFLEX MICROSCOPIC
Bilirubin Urine: NEGATIVE
Ketones, ur: NEGATIVE mg/dL
Leukocytes, UA: NEGATIVE
Nitrite: NEGATIVE
Specific Gravity, Urine: 1.01 (ref 1.005–1.030)
Urobilinogen, UA: 0.2 mg/dL (ref 0.0–1.0)
pH: 7.5 (ref 5.0–8.0)

## 2011-07-31 LAB — CK TOTAL AND CKMB (NOT AT ARMC)
CK, MB: 4.7 ng/mL — ABNORMAL HIGH (ref 0.3–4.0)
Relative Index: INVALID (ref 0.0–2.5)
Total CK: 56 U/L (ref 7–177)

## 2011-07-31 LAB — TYPE AND SCREEN: Antibody Screen: NEGATIVE

## 2011-07-31 LAB — TROPONIN I: Troponin I: <0.3 ng/mL (ref <0.30)

## 2011-07-31 NOTE — H&P (Signed)
Charlene Black, Charlene Black NO.:  [id number]  MEDICAL RECORD NO.:  [id number]  LOCATION:  [facility]                         FACILITY:  [facility]  PHYSICIAN:  Talmage Nap, MD  DATE OF BIRTH:  Mar 21, 1934  DATE OF ADMISSION:  07/30/2011 DATE OF DISCHARGE:                             HISTORY & PHYSICAL   PRIMARY CARE PHYSICIAN:  [name], MD  PRIMARY NEUROLOGIST:  Levert Feinstein, MD  History obtainable from the patient and the patient's daughter.  CHIEF COMPLAINT:  Recurrent painless nasal bleed.  HISTORY OF PRESENT ILLNESS:  The patient is a 77 year old Caucasian female with history of adenocarcinoma of the stomach status post chemo radiotherapy with the patient currently receiving with other medical problems presenting to the emergency room with  painless nasal bleed. The patient claimed that about a week prior to presenting to the emergency room following chemo radiotherapy for adenocarcinoma of the stomach, she developed nasal bleed.  At that time also the patient claimed her noses were very dry.  She denied any fever.  She denied any chills.  She denied any rigor.  She denied any chest pain or shortness of breath.  With application of pressure, the nasal bleed resolved; however second episode started today early hours of the day and has been nonstop.  She denied any history of trauma prior to the onset of the nasal bleed.  She denied picking up nose.  She denied any history of fever.  She denied any chills or rigors.  She denied any chest pain or shortness of breath.  At the time, the patient was seen by me.  She complained of feeling very dizzy and having headaches.  She denied any shortness of breath.  The patient also complained of generalized abdominal discomfort with multiple episodes of loose stools and also a dizzy spell.  She denied any dysuria or hematuria.  After evaluating the patient, the patient had nasal packing to her right nostril  and subsequently admitted for stabilization.  PAST MEDICAL HISTORY: 1. Positive for adenocarcinoma of the stomach status post chemo     radiotherapy. 2. Atrial fibrillation. 3. Uterine cancer. 4. History of DVT. 5. History of depression. 6. Gastroesophageal reflux disease. 7. Glaucoma. 8. Hyperlipidemia. 9. Hypothyroidism. 10.History of cerebrovascular accident. 11.Marfan syndrome. 12.History of urinary incontinence. 13.Chronic pain. 14.Right ankle fractures. 15.History of arachnoid spinal cyst. 16.History of syncope.  PAST SURGICAL HISTORY: 1. Hysterectomy secondary to endometrial CA. 2. Cervical diskectomy. 3. Basal call CA nasal bridge status post excision. 4. Multiple eye surgeries with lens implant. 5. Fracture of the right ankle status post cast.  PREADMISSION MEDICATIONS:  The patient could not recall; however in the reconciliation by the pharmacist has been awaited.  ALLERGIES:  AMIKACIN, AMPICILLIN, BENADRYL, CEFAZOLIN, CEFOXITIN, MANDELAMINE, MORPHINE, NALIDIXIC ACID,  PREDNISONE, SULFA DRUGS, TETRACYCLINE, TOBRADEX, and TYLENOL NO. 3.  SOCIAL HISTORY:  The patient is a resident of the [facility], undergoing rehab.  No history of alcohol or tobacco abuse.  FAMILY HISTORY:  Said to positive for breast CA and leukemia.  REVIEW OF SYSTEMS:  The patient complained about headaches and pain around the nasal bridge, especially when we will have the nasal packing  on the right side and she also complained about being feeling dizzy. She denied any fever, chills, rigor.  No chest pain or shortness of breath.  Complained of nausea but no vomiting.  No cough.  Her complaint of generalized abdominal cramps with multiple episodes of diarrhea that is nonbloody.  Denies any dysuria or hematuria.  No swelling of the lower extremity, has a cast on a right leg.  No intolerance to heat or cold and no neuropsychiatric disorder.  PHYSICAL EXAMINATION:  GENERAL: An elderly  lady, dehydrated, in painful distress but not in any respiratory distress. VITAL SIGNS:  Blood pressure is 147/86, pulse is 105, respiratory rate is 17. HEENT:  Pupils are reactive to light and extraocular muscles are intact. She has a right nasal package. NECK:  No jugular venous distention.  No carotid bruit.  No lymphadenopathy. CHEST:  Clear to auscultation. HEART:  Sounds are 1 and 2, tachycardic. ABDOMEN:  Soft with generalized vague tenderness.  Liver, spleen, kidney not palpable.  Bowel sounds are positive. EXTREMITIES:  Cast right leg.  No pedal edema. NEUROLOGIC:  Nonfocal. MUSCULOSKELETAL:  Arthritic changes in the knees and in the feet. NEUROPSYCHIATRIC:  Unremarkable and skin showed decreased turgor.  LABORATORY DATA:  Complete blood count with differential showed WBC of 7.6, hemoglobin of 12.2, hematocrit 36.4, MCV 95.3, platelet count of 191, neutrophils 92%, and absolute neutrophil count is 7.0.  Coagulation profile showed PT 28.8, INR 2.66.  Basic metabolic panel not ordered in the emergency room.  EKG showed atrial fibrillation with rapid ventricular rate of about 123.  No acute ST wave change noted.  IMPRESSION: 1. Recurrent epistaxis status post right nasal packing. 2. Adenocarcinoma of the stomach status post chemo radiotherapy. 3. Atrial fibrillation. 4. History of deep venous thrombosis. 5. Peripheral neuropathy. 6. Gastroesophageal reflux disease. 7. Hyperlipidemia. 8. Hypothyroidism. 9. History of Marfan syndrome. 10.History of urinary incontinence. 11.History of depression. 12.Glaucoma.  PLAN:   Will admit the patient to general medical floor.  The patient will be slowly rehydrated with normal saline IV to go at rate of 75 mL an hour.  She will be typed and crossed, transfused with 2 units of FFP. She empirically will be given clindamycin 600 mg IV q.8 h.  Other medications to be given to the patient include Zofran 4 mg IV q.4 h. p.r.n. for nausea  and vomiting, and she will be on Levsin 0.125 mg p.o. t.i.d.  The patient had ventricular rate to be controlled with Lopressor 50 mg p.o. b.i.d.  Her pain will be controlled with Dilaudid 1 mg IV q.4 h. p.r.n.  She will also be on nystatin mouthwash 10 mL p.o. t.i.d. for questionable oral thrush, which she has been on treatment for prior to this admission.  GI prophylaxis will be done with Protonix 40 mg IV q.24 h., DVT prophylaxis with TED stockings.  Further workup to be done on this patient will include H and H q.6 h. CMP and magnesium stat and to be repeated in a.m.  CBC in a.m.  Coagulation profile, PT, PTT, and INR will be repeated in a.m.  The patient was to have UA stat done.  She will be followed and evaluated on day-to-day basis.     Talmage Nap, MD     CN/MEDQ  D:  07/30/2011  T:  07/30/2011  Job:  [id number]  Electronically Signed by Talmage Nap  on 07/31/2011 02:19:35 AM

## 2011-08-01 LAB — PREPARE FRESH FROZEN PLASMA: Unit division: 0

## 2011-08-04 ENCOUNTER — Encounter (HOSPITAL_BASED_OUTPATIENT_CLINIC_OR_DEPARTMENT_OTHER): Payer: Medicare Other | Admitting: Hematology and Oncology

## 2011-08-04 ENCOUNTER — Other Ambulatory Visit: Payer: Self-pay | Admitting: Hematology and Oncology

## 2011-08-04 DIAGNOSIS — C160 Malignant neoplasm of cardia: Secondary | ICD-10-CM

## 2011-08-04 LAB — COMPREHENSIVE METABOLIC PANEL
ALT: 16 U/L (ref 0–35)
AST: 18 U/L (ref 0–37)
Albumin: 3.7 g/dL (ref 3.5–5.2)
Alkaline Phosphatase: 51 U/L (ref 39–117)
Calcium: 9 mg/dL (ref 8.4–10.5)
Chloride: 99 mEq/L (ref 96–112)
Potassium: 4.7 mEq/L (ref 3.5–5.3)
Sodium: 132 mEq/L — ABNORMAL LOW (ref 135–145)
Total Protein: 6 g/dL (ref 6.0–8.3)

## 2011-08-04 LAB — CBC WITH DIFFERENTIAL/PLATELET
BASO%: 0.3 % (ref 0.0–2.0)
EOS%: 0.1 % (ref 0.0–7.0)
HGB: 11.3 g/dL — ABNORMAL LOW (ref 11.6–15.9)
MCH: 33.5 pg (ref 25.1–34.0)
MCV: 97.1 fL (ref 79.5–101.0)
MONO%: 6.7 % (ref 0.0–14.0)
RBC: 3.38 10*6/uL — ABNORMAL LOW (ref 3.70–5.45)
RDW: 15 % — ABNORMAL HIGH (ref 11.2–14.5)
lymph#: 0.2 10*3/uL — ABNORMAL LOW (ref 0.9–3.3)

## 2011-08-05 ENCOUNTER — Encounter (HOSPITAL_BASED_OUTPATIENT_CLINIC_OR_DEPARTMENT_OTHER): Payer: Medicare Other | Admitting: Hematology and Oncology

## 2011-08-05 DIAGNOSIS — Z5111 Encounter for antineoplastic chemotherapy: Secondary | ICD-10-CM

## 2011-08-05 DIAGNOSIS — C160 Malignant neoplasm of cardia: Secondary | ICD-10-CM

## 2011-08-10 ENCOUNTER — Other Ambulatory Visit (HOSPITAL_COMMUNITY): Payer: Self-pay | Admitting: Radiation Oncology

## 2011-08-11 ENCOUNTER — Other Ambulatory Visit: Payer: Self-pay | Admitting: Hematology and Oncology

## 2011-08-11 ENCOUNTER — Encounter (HOSPITAL_BASED_OUTPATIENT_CLINIC_OR_DEPARTMENT_OTHER): Payer: Medicare Other | Admitting: Hematology and Oncology

## 2011-08-11 ENCOUNTER — Inpatient Hospital Stay (HOSPITAL_COMMUNITY)
Admission: EM | Admit: 2011-08-11 | Discharge: 2011-08-20 | DRG: 374 | Disposition: A | Discharge status: Hospice | Payer: Medicare Other | Attending: Internal Medicine | Admitting: Internal Medicine

## 2011-08-11 ENCOUNTER — Emergency Department (HOSPITAL_COMMUNITY): Payer: Medicare Other

## 2011-08-11 DIAGNOSIS — Z87898 Personal history of other specified conditions: Secondary | ICD-10-CM

## 2011-08-11 DIAGNOSIS — R339 Retention of urine, unspecified: Secondary | ICD-10-CM | POA: Diagnosis not present

## 2011-08-11 DIAGNOSIS — G609 Hereditary and idiopathic neuropathy, unspecified: Secondary | ICD-10-CM | POA: Diagnosis present

## 2011-08-11 DIAGNOSIS — K219 Gastro-esophageal reflux disease without esophagitis: Secondary | ICD-10-CM | POA: Diagnosis present

## 2011-08-11 DIAGNOSIS — T451X5A Adverse effect of antineoplastic and immunosuppressive drugs, initial encounter: Secondary | ICD-10-CM | POA: Diagnosis present

## 2011-08-11 DIAGNOSIS — R279 Unspecified lack of coordination: Secondary | ICD-10-CM

## 2011-08-11 DIAGNOSIS — F3289 Other specified depressive episodes: Secondary | ICD-10-CM | POA: Diagnosis present

## 2011-08-11 DIAGNOSIS — Z515 Encounter for palliative care: Secondary | ICD-10-CM

## 2011-08-11 DIAGNOSIS — Z8673 Personal history of transient ischemic attack (TIA), and cerebral infarction without residual deficits: Secondary | ICD-10-CM

## 2011-08-11 DIAGNOSIS — Z86718 Personal history of other venous thrombosis and embolism: Secondary | ICD-10-CM

## 2011-08-11 DIAGNOSIS — K2210 Ulcer of esophagus without bleeding: Secondary | ICD-10-CM | POA: Diagnosis present

## 2011-08-11 DIAGNOSIS — E876 Hypokalemia: Secondary | ICD-10-CM | POA: Diagnosis not present

## 2011-08-11 DIAGNOSIS — C801 Malignant (primary) neoplasm, unspecified: Secondary | ICD-10-CM | POA: Diagnosis present

## 2011-08-11 DIAGNOSIS — E785 Hyperlipidemia, unspecified: Secondary | ICD-10-CM | POA: Diagnosis present

## 2011-08-11 DIAGNOSIS — C169 Malignant neoplasm of stomach, unspecified: Secondary | ICD-10-CM

## 2011-08-11 DIAGNOSIS — R5081 Fever presenting with conditions classified elsewhere: Secondary | ICD-10-CM | POA: Diagnosis not present

## 2011-08-11 DIAGNOSIS — D61810 Antineoplastic chemotherapy induced pancytopenia: Secondary | ICD-10-CM | POA: Diagnosis present

## 2011-08-11 DIAGNOSIS — H409 Unspecified glaucoma: Secondary | ICD-10-CM | POA: Diagnosis present

## 2011-08-11 DIAGNOSIS — Z79899 Other long term (current) drug therapy: Secondary | ICD-10-CM

## 2011-08-11 DIAGNOSIS — E871 Hypo-osmolality and hyponatremia: Secondary | ICD-10-CM | POA: Diagnosis present

## 2011-08-11 DIAGNOSIS — E119 Type 2 diabetes mellitus without complications: Secondary | ICD-10-CM | POA: Diagnosis present

## 2011-08-11 DIAGNOSIS — I059 Rheumatic mitral valve disease, unspecified: Secondary | ICD-10-CM | POA: Diagnosis present

## 2011-08-11 DIAGNOSIS — R110 Nausea: Secondary | ICD-10-CM | POA: Diagnosis present

## 2011-08-11 DIAGNOSIS — K222 Esophageal obstruction: Secondary | ICD-10-CM | POA: Diagnosis present

## 2011-08-11 DIAGNOSIS — F329 Major depressive disorder, single episode, unspecified: Secondary | ICD-10-CM | POA: Diagnosis present

## 2011-08-11 DIAGNOSIS — N390 Urinary tract infection, site not specified: Secondary | ICD-10-CM | POA: Diagnosis present

## 2011-08-11 DIAGNOSIS — E039 Hypothyroidism, unspecified: Secondary | ICD-10-CM | POA: Diagnosis present

## 2011-08-11 DIAGNOSIS — T45515A Adverse effect of anticoagulants, initial encounter: Secondary | ICD-10-CM | POA: Diagnosis present

## 2011-08-11 DIAGNOSIS — K209 Esophagitis, unspecified without bleeding: Secondary | ICD-10-CM | POA: Diagnosis present

## 2011-08-11 DIAGNOSIS — D751 Secondary polycythemia: Secondary | ICD-10-CM | POA: Diagnosis present

## 2011-08-11 DIAGNOSIS — R1310 Dysphagia, unspecified: Secondary | ICD-10-CM | POA: Diagnosis present

## 2011-08-11 DIAGNOSIS — R040 Epistaxis: Secondary | ICD-10-CM | POA: Diagnosis present

## 2011-08-11 DIAGNOSIS — Z923 Personal history of irradiation: Secondary | ICD-10-CM

## 2011-08-11 DIAGNOSIS — Z66 Do not resuscitate: Secondary | ICD-10-CM | POA: Diagnosis not present

## 2011-08-11 DIAGNOSIS — Z7901 Long term (current) use of anticoagulants: Secondary | ICD-10-CM

## 2011-08-11 DIAGNOSIS — R51 Headache: Secondary | ICD-10-CM | POA: Diagnosis not present

## 2011-08-11 DIAGNOSIS — Z8542 Personal history of malignant neoplasm of other parts of uterus: Secondary | ICD-10-CM

## 2011-08-11 DIAGNOSIS — B370 Candidal stomatitis: Secondary | ICD-10-CM | POA: Diagnosis present

## 2011-08-11 DIAGNOSIS — Q8740 Marfan's syndrome, unspecified: Secondary | ICD-10-CM

## 2011-08-11 DIAGNOSIS — D61818 Other pancytopenia: Secondary | ICD-10-CM | POA: Diagnosis present

## 2011-08-11 DIAGNOSIS — C160 Malignant neoplasm of cardia: Secondary | ICD-10-CM

## 2011-08-11 DIAGNOSIS — N182 Chronic kidney disease, stage 2 (mild): Secondary | ICD-10-CM | POA: Diagnosis present

## 2011-08-11 DIAGNOSIS — K589 Irritable bowel syndrome without diarrhea: Secondary | ICD-10-CM | POA: Diagnosis present

## 2011-08-11 DIAGNOSIS — I4891 Unspecified atrial fibrillation: Secondary | ICD-10-CM | POA: Diagnosis present

## 2011-08-11 HISTORY — DX: Malignant neoplasm of stomach, unspecified: C16.9

## 2011-08-11 HISTORY — DX: Essential (primary) hypertension: I10

## 2011-08-11 HISTORY — DX: Chronic kidney disease, unspecified: N18.9

## 2011-08-11 LAB — CBC WITH DIFFERENTIAL/PLATELET
EOS%: 0 % (ref 0.0–7.0)
Eosinophils Absolute: 0 10*3/uL (ref 0.0–0.5)
LYMPH%: 7.7 % — ABNORMAL LOW (ref 14.0–49.7)
MCH: 31.8 pg (ref 25.1–34.0)
MCHC: 34.1 g/dL (ref 31.5–36.0)
MCV: 93.2 fL (ref 79.5–101.0)
MONO%: 6.8 % (ref 0.0–14.0)
NEUT#: 1 10*3/uL — ABNORMAL LOW (ref 1.5–6.5)
Platelets: 120 10*3/uL — ABNORMAL LOW (ref 145–400)
RBC: 3.24 10*6/uL — ABNORMAL LOW (ref 3.70–5.45)
RDW: 14.8 % — ABNORMAL HIGH (ref 11.2–14.5)
nRBC: 0 % (ref 0–0)

## 2011-08-11 LAB — DIFFERENTIAL
Eosinophils Absolute: 0 10*3/uL (ref 0.0–0.7)
Eosinophils Relative: 0 % (ref 0–5)
Lymphs Abs: 0 10*3/uL — ABNORMAL LOW (ref 0.7–4.0)
Neutro Abs: 0 10*3/uL — ABNORMAL LOW (ref 1.7–7.7)
nRBC: 0 /100 WBC

## 2011-08-11 LAB — PROTIME-INR: Prothrombin Time: 60.1 seconds — ABNORMAL HIGH (ref 11.6–15.2)

## 2011-08-11 LAB — BASIC METABOLIC PANEL
BUN: 24 mg/dL — ABNORMAL HIGH (ref 6–23)
Calcium: 8.5 mg/dL (ref 8.4–10.5)
Creatinine, Ser: 0.94 mg/dL (ref 0.50–1.10)
Glucose, Bld: 120 mg/dL — ABNORMAL HIGH (ref 70–99)

## 2011-08-11 LAB — TROPONIN I: Troponin I: <0.3 ng/mL (ref <0.30)

## 2011-08-11 LAB — CBC
HCT: 59.1 % — ABNORMAL HIGH (ref 36.0–46.0)
MCH: 33 pg (ref 26.0–34.0)
MCV: 94.1 fL (ref 78.0–100.0)
RBC: 6.28 MIL/uL — ABNORMAL HIGH (ref 3.87–5.11)
RDW: 14.9 % (ref 11.5–15.5)
WBC: 0.5 10*3/uL — CL (ref 4.0–10.5)

## 2011-08-11 LAB — COMPREHENSIVE METABOLIC PANEL
ALT: 27 U/L (ref 0–35)
AST: 22 U/L (ref 0–37)
Albumin: 3.2 g/dL — ABNORMAL LOW (ref 3.5–5.2)
Alkaline Phosphatase: 44 U/L (ref 39–117)
Chloride: 98 mEq/L (ref 96–112)
Potassium: 4.1 mEq/L (ref 3.5–5.1)
Total Bilirubin: 0.3 mg/dL (ref 0.3–1.2)

## 2011-08-11 LAB — CK TOTAL AND CKMB (NOT AT ARMC): Relative Index: INVALID (ref 0.0–2.5)

## 2011-08-12 ENCOUNTER — Ambulatory Visit (HOSPITAL_COMMUNITY): Payer: Medicare Other

## 2011-08-12 ENCOUNTER — Other Ambulatory Visit (HOSPITAL_COMMUNITY): Payer: Medicare Other

## 2011-08-12 LAB — DIFFERENTIAL
Basophils Relative: 0 % (ref 0–1)
Eosinophils Relative: 0 % (ref 0–5)
Lymphs Abs: 0 10*3/uL — ABNORMAL LOW (ref 0.7–4.0)
Neutrophils Relative %: 0 % — ABNORMAL LOW (ref 43–77)
nRBC: 0 /100 WBC

## 2011-08-12 LAB — CBC
MCV: 95.2 fL (ref 78.0–100.0)
Platelets: 97 10*3/uL — ABNORMAL LOW (ref 150–400)
RBC: 2.71 MIL/uL — ABNORMAL LOW (ref 3.87–5.11)
WBC: 0.6 10*3/uL — CL (ref 4.0–10.5)

## 2011-08-12 LAB — BASIC METABOLIC PANEL
BUN: 15 mg/dL (ref 6–23)
Chloride: 100 mEq/L (ref 96–112)
GFR calc Af Amer: >90 mL/min (ref >90)
GFR calc non Af Amer: 89 mL/min — ABNORMAL LOW (ref >90)
Glucose, Bld: 107 mg/dL — ABNORMAL HIGH (ref 70–99)
Potassium: 3.9 mEq/L (ref 3.5–5.1)
Sodium: 133 mEq/L — ABNORMAL LOW (ref 135–145)

## 2011-08-12 LAB — CARDIAC PANEL(CRET KIN+CKTOT+MB+TROPI)
CK, MB: 3 ng/mL (ref 0.3–4.0)
CK, MB: 2.9 ng/mL (ref 0.3–4.0)
Total CK: 37 U/L (ref 7–177)
Troponin I: <0.3 ng/mL (ref <0.30)
Troponin I: <0.3 ng/mL (ref <0.30)

## 2011-08-12 LAB — PROTIME-INR: Prothrombin Time: 25.4 seconds — ABNORMAL HIGH (ref 11.6–15.2)

## 2011-08-12 NOTE — H&P (Signed)
Charlene Black, Charlene Black NO.:  [id number]  MEDICAL RECORD NO.:  [id number]  LOCATION:  [facility]                         FACILITY:  [facility]  PHYSICIAN:  Andreas Blower, MD       DATE OF BIRTH:  1934/05/21  DATE OF ADMISSION:  08/11/2011 DATE OF DISCHARGE:                             HISTORY & PHYSICAL   PRIMARY CARE PHYSICIAN:  [name], MD.  PRIMARY ONCOLOGIST:  Laurice Record, M.D.  ENT PHYSICIAN:  Suzanna Obey, MD.  PRIMARY NEUROLOGIST:  Levert Feinstein, MD  CHIEF COMPLAINT:  Nausea, vomiting, chest pain, and general decline.  HISTORY OF PRESENT ILLNESS:  Ms. Caserta is a 77 year old Caucasian female with history of adenocarcinoma of the stomach, with currently receiving chemotherapy and radiation; AFib; history of recurrent epistaxis; history of uterine cancer; history of DVT; history of depression, who presents with the above complaints.  The patient was recently hospitalized from October 11 - 12 of 2012 for epistaxis.  Atdischarge, the patient was instructed to hold her Coumadin.  As her epistaxis was resolved, the patient resumed back on Coumadin.  The patient had been receiving chemotherapy and radiation under the care of Dr. Dalene Carrow for her adenocarcinoma of the stomach.  Since receiving chemotherapy and radiation, the patient has had a general decline in her functional status.  She has become increasingly weak and has difficulty doing activities such as walking and she is no longer able to get out of bed.  Because she was feeling so weak, she had declined radiation therapy today and presents to the ER for further evaluation.  The patient does report that she has had some nausea, vomiting, and some epigastric pain over the last week.  She was also recently diagnosed with thrush and was started on miconazole.  Given her general medical condition, the hospitalist service was asked to admit the patient for further care and management.  The patient denies any  recent fevers, does feel cold at times.  Currently denies any chest pain, does feel nauseated slightly, but has not vomited today.  She does report vomiting a few days ago and she has difficulty keeping her oral intake.  Denies any abdominal pain.  Currently, denies any diarrhea.  Denies any headaches or vision changes.  REVIEW OF SYSTEMS:  All systems were reviewed with the patient, was positive as per HPI, otherwise all other systems are negative.  PAST MEDICAL HISTORY: 1. History of epistaxis, status post packing in the emergency     department on July 30, 2011.  Packing removed after the patient     was discharged as outpatient. 2. AFib.  The patient was maintained on chronic anticoagulation on     Coumadin. 3. History of adenocarcinoma of the stomach, status post chemotherapy     and radiation. 4. History of uterine cancer. 5. History of DVT. 6. Depression. 7. GERD. 8. History of glaucoma. 9. Hyperlipidemia. 10.Hypothyroidism. 11.History of urinary incontinence.  SOCIAL HISTORY:  The patient does not smoke, does not drink any alcohol. Currently resides at [facility].  FAMILY HISTORY:  Significant for mother having liver cancer.  The patient's father had leukemia.  DRUG ALLERGIES:  She has  multiple drug allergies to AMIKACIN, AMPICILLIN, CEFAZOLIN, BENADRYL, CEFOXITIN, MORPHINE, PREDNISONE, TETRACYCLINES, SULFA, TOBRADEX, TRIMETHAPHAN, TYLENOL WITH CODEINE NO. 3.  HOME MEDICATIONS: 1. Hydrocodone/APAP 7.5/500, two to three tablespoons every 4 hours as     needed for pain. 2. Zofran 8 mg twice daily as needed for nausea. 3. MiraLax 17 g daily for constipation. 4. Megace 2 tablespoons daily. 5. Flonase 2 sprays b.i.d. 6. Dexamethasone 8 mg daily on Monday Wednesday Friday with food. 7. Carbidopa/levodopa 10/100 mg p.o. twice daily. 8. Lactulose 2 tablespoons daily as needed for constipation. 9. Miconazole powder 2% over-the-counter, one application applied to      gluteal folds every shift. 10.Warfarin 8 mg daily. 11.Biotin Liquid for dry mouth over the counter 1 teaspoon 3 times a     day with meals. 12.Acetaminophen 650 mg 3 times a day as needed. 13.Tramadol 50 mg 4 times daily as needed for pain. 14.Niacin 500 mg half an hour after taking aspirin. 15.Nexium 400 mg p.o. daily. 16.CertaVite with antioxidants 1 tablet p.o. daily. 17.Gabapentin 200 mg 3 times a day. 18.Colace 100 mg p.o. twice daily. 19.Duloxetine 20 mg p.o. daily. 20.Bisacodyl suppository 5 mg p.o. daily. 21.Brimonidine ophthalmic solution 0.15% in both eyes twice daily. 22.Saline mist spray 0.5% over-the-counter, 1 spray 5-6 times daily a     day. 23.Oxybutynin XL 10 mg p.o. every other day.  PHYSICAL EXAM:  VITAL SIGNS:  Temperature is afebrile, blood pressure is 140/76, pulse 80, respiration 20, satting at 100% on 3 L of oxygen. GENERAL:  The patient was alert and oriented, not in acute distress, was lying in bed comfortably. HEENT:  Extraocular motions are intact.  Pupils equal, round.  Had moist mucous membranes. NECK:  Supple. HEART:  Irregular with S1-S2. LUNGS:  Clear to auscultation bilaterally.  ABDOMEN:  Soft, nontender, nondistended.  Positive bowel sounds. EXTREMITIES:  The patient had good peripheral pulses with trace edema. Right lower extremity in a brace.  RADIOLOGY/IMAGING:  The patient had chest x-ray, which showed stable cardiomegaly.  No acute findings.  LAB:  CBC shows a white count of 0.5, hemoglobin 20.5, hematocrit 59.1, platelet count 36, INR is 6.83.  Electrolytes normal except BUN is 22, creatinine 0.76.  Liver function tests normal except albumin 3.2, troponin is less than 0.30.  ASSESSMENT/PLAN: 1. Nausea and vomiting.  Suspect it is likely due to chemotherapy     and radiation.  Continue PPI.  Given mild dehydration from nausea     and vomiting, we will hydrate the patient on IV fluids.  We will     start the patient on clear liquid  diet and we will advance diet as     tolerated.  If the patient has persistent nausea and vomiting, the     patient may need GI evaluation. 2. Chest pain.  The patient's heart rate controlled.  We will trend the     patient's troponin, initial troponin is negative.  Uncertain if the     patient's persistent nausea and vomiting is causing the chest pain.     The patient's INR is supratherapeutic, so low suspicion for PE. 3. Supratherapeutic INR with thrombocytopenia.  I will give the     patient vitamin K to reverse her anticoagulation.  Thrombocytopenia     likely due to recent administration of the chemotherapy for     adenocarcinoma of the stomach. 4. AFib, the patient is rate controlled.  ED has contacted Cardiology     to help determine whether or  not the patient needs to be on long-     term anticoagulation on Coumadin.  Would favor holding the     patient's Coumadin at this time given the multiple comorbidities. 5. Adenocarcinoma of the stomach.  Further management as per Dr.     Dalene Carrow. 6. Leukopenia with neutropenia and thrombocytopenia, likely due to     adenocarcinoma of the stomach.  Dr. Dalene Carrow has re-evaluated the     patient and started the patient on prophylactic Cipro at 500 mg     twice daily.  Also started the patient on fluconazole 100 mg     IV times 5 doses.  We will also panculture if the patient has a     fever greater than 100.1. 7. History of deep venous thrombosis and has been on Coumadin longer     than a year.  Not an active issue at this time. 8. Depression, stable.  Continue the patient on home medications. 9. Gastroesophageal reflux disease.  Continue the patient on PPI. 10.Hyperlipidemia, stable. 11.Hypothyroidism, stable. 12.Polycythemia, maybe due to patient's adenocarcinoma of the stomach     with chemotherapy.  Continue to monitor. 13.Hyponatremia, likely due to nausea and vomiting.  Monitor for now. 14.Prophylaxis, SCDs.  Given the patient's  thrombocytopenia, we would     avoid heparin products at this time.  The patient is currently     anticoagulated with supratherapeutic INR of 6.8.  We will give the     patient a low dose vitamin K to reverse anticoagulation. 15.Code status:  The patient is full code.  This was discussed with     the patient and daughter at the time of admission.  Dr. Dalene Carrow     indicated that she will discuss with the patient and daughter about     the code status during this admission and possible palliative care     consultation.  Will defer to Dr. Dalene Carrow.  Time spent on admission talking to the patient's daughter, consultants, and coordinating care was 1 hour.     Andreas Blower, MD     SR/MEDQ  D:  08/11/2011  T:  08/11/2011  Job:  [id number]  Electronically Signed by Wardell Heath Kimmerly Lora  on 08/12/2011 12:35:21 AM

## 2011-08-13 DIAGNOSIS — I4891 Unspecified atrial fibrillation: Secondary | ICD-10-CM

## 2011-08-13 DIAGNOSIS — R1310 Dysphagia, unspecified: Secondary | ICD-10-CM

## 2011-08-13 LAB — BASIC METABOLIC PANEL
BUN: 16 mg/dL (ref 6–23)
Calcium: 8.5 mg/dL (ref 8.4–10.5)
GFR calc Af Amer: >90 mL/min (ref >90)
GFR calc non Af Amer: 87 mL/min — ABNORMAL LOW (ref >90)
Glucose, Bld: 108 mg/dL — ABNORMAL HIGH (ref 70–99)

## 2011-08-13 LAB — DIFFERENTIAL
Basophils Relative: 1 % (ref 0–1)
Eosinophils Absolute: 0 10*3/uL (ref 0.0–0.7)
Eosinophils Relative: 1 % (ref 0–5)
Lymphocytes Relative: 11 % — ABNORMAL LOW (ref 12–46)
Monocytes Relative: 20 % — ABNORMAL HIGH (ref 3–12)
Neutrophils Relative %: 67 % (ref 43–77)

## 2011-08-13 LAB — URINALYSIS, ROUTINE W REFLEX MICROSCOPIC
Bilirubin Urine: NEGATIVE
Ketones, ur: NEGATIVE mg/dL
Nitrite: NEGATIVE
Protein, ur: 30 mg/dL — AB
Urobilinogen, UA: 1 mg/dL (ref 0.0–1.0)

## 2011-08-13 LAB — URINE MICROSCOPIC-ADD ON

## 2011-08-13 LAB — CBC
Hemoglobin: 8.9 g/dL — ABNORMAL LOW (ref 12.0–15.0)
MCHC: 35 g/dL (ref 30.0–36.0)
WBC: 1 10*3/uL — CL (ref 4.0–10.5)

## 2011-08-13 LAB — PROTIME-INR
INR: 4.2 — ABNORMAL HIGH (ref 0.00–1.49)
Prothrombin Time: 41.1 seconds — ABNORMAL HIGH (ref 11.6–15.2)

## 2011-08-14 DIAGNOSIS — D709 Neutropenia, unspecified: Secondary | ICD-10-CM

## 2011-08-14 DIAGNOSIS — R1310 Dysphagia, unspecified: Secondary | ICD-10-CM

## 2011-08-14 LAB — PROTIME-INR
INR: 5.66 (ref 0.00–1.49)
Prothrombin Time: 51.9 seconds — ABNORMAL HIGH (ref 11.6–15.2)

## 2011-08-14 LAB — CBC
Platelets: 109 10*3/uL — ABNORMAL LOW (ref 150–400)
RDW: 14.8 % (ref 11.5–15.5)
WBC: 1.5 10*3/uL — ABNORMAL LOW (ref 4.0–10.5)

## 2011-08-14 LAB — COMPREHENSIVE METABOLIC PANEL
ALT: 18 U/L (ref 0–35)
AST: 14 U/L (ref 0–37)
Albumin: 2.5 g/dL — ABNORMAL LOW (ref 3.5–5.2)
Calcium: 8.6 mg/dL (ref 8.4–10.5)
Chloride: 100 mEq/L (ref 96–112)
Creatinine, Ser: 0.51 mg/dL (ref 0.50–1.10)
Sodium: 132 mEq/L — ABNORMAL LOW (ref 135–145)
Total Bilirubin: 0.3 mg/dL (ref 0.3–1.2)

## 2011-08-14 LAB — DIFFERENTIAL
Band Neutrophils: 10 % (ref 0–10)
Basophils Absolute: 0 10*3/uL (ref 0.0–0.1)
Basophils Relative: 0 % (ref 0–1)
Lymphocytes Relative: 9 % — ABNORMAL LOW (ref 12–46)
Monocytes Relative: 16 % — ABNORMAL HIGH (ref 3–12)

## 2011-08-14 NOTE — Discharge Summary (Signed)
Charlene Black, Charlene Black NO.:  [id number]  MEDICAL RECORD NO.:  [id number]  LOCATION:  5039                         FACILITY:  [facility]  PHYSICIAN:  Peggye Pitt, M.D. DATE OF BIRTH:  Dec 01, 1933  DATE OF ADMISSION:  07/30/2011 DATE OF DISCHARGE:  07/31/2011                        DISCHARGE SUMMARY - REFERRING   PRIMARY CARE PHYSICIAN:  [name], MD  ENT PHYSICIAN:  Suzanna Obey, MD  DISCHARGE DIAGNOSES: 1. Right naris epistaxis status post packing in the emergency     department. 2. Atrial fibrillation, maintained on chronic anticoagulation with     Coumadin, Coumadin discontinued given epistaxis. 3. Adenocarcinoma of the stomach status post chemo and radiation. 4. Uterine cancer. 5. History of deep venous thrombosis. 6. Depression. 7. Gastroesophageal reflux disease. 8. Glaucoma. 9. Hyperlipidemia. 10.Hypothyroidism. 11.History of urinary incontinence.  DISCHARGE MEDICATIONS: 1. Alphagan 1 drop in both eyes twice daily. 2. Bisacodyl 5 mg daily. 3. Sinemet 1 tablet twice daily. 4. Flexeril 10 mg daily as needed for leg spasms. 5. Cymbalta 20 mg daily. 6. Dexamethasone 4 mg 2 tablets daily for 3 days after chemo. 7. Colace 100 mg twice daily. 8. Flonase 1 spray twice daily. 9. Neurontin 600 mg 2 tablets 3 times a day. 10.Vicodin 5/500 mg 2 tablets every 4 hours as needed for pain. 11.Maalox 30 mL by mouth every 4 hours as needed for heartburn. 12.Megace suspension 10 mL daily. 13.MiraLAX 17 g daily. 14.Multivitamin 1 tablet daily. 15.Nexium 40 mg daily. 16.Niacin 500 mg 1 tablet daily. 17.Zofran 8 mg twice daily as needed for nausea. 18.Oxybutynin XL 10 mg daily. 19.Tramadol 50 mg 4 times daily as needed for pain. 20.Tylenol 325 mg 2 tablets 3 times a day as needed for pain.  DISPOSITION AND FOLLOWUP:  Charlene Black will be discharged home today in stable condition.  We have scheduled an outpatient followup with Dr. Suzanna Obey with [facility]  ENT on Tuesday, August 04, 2011, at 2:15 p.m.  She will also need to follow up with Dr. Benedetto Goad approximately 2 weeks after discharge.  IMAGES AND PROCEDURES THIS HOSPITALIZATION:  None.  HISTORY AND PHYSICAL:  For full details, please refer to dictation on July 31, 2011, by Dr. Beverly Gust; however, in brief Charlene Black is a 77- year-old woman with a history of chronic Coumadin secondary to atrial fibrillation and DVT and adenocarcinoma of the stomach who is currently undergoing chemoradiation treatment who came to the emergency room with painless epistaxis.  Because of this, we were asked to admit her for further evaluation.  HOSPITAL COURSE: 1. Epistaxis.  The patient's right naris has been packed by the     emergency department.  Her Coumadin has been discontinued and in     fact she was given vitamin K and FFP to reverse.  Her Coumadin has     been effectively reversed.  I will recommend that she stay off     Coumadin until after seen by Dr. Jearld Fenton to ensure that it is safe to     restart.  She has a followup appointment with Dr. Jearld Fenton as     scheduled above to remove the packing and further inspection. 2. Rest of chronic  medical conditions are stable.  VITALS ON THE DAY OF DISCHARGE:  Blood pressure 131/78, heart rate 100, respirations 20, sats are 95% on room air, temp 98.6.     Peggye Pitt, M.D.     EH/MEDQ  D:  07/31/2011  T:  07/31/2011  Job:  [id number]  cc:   [name], M.D. Suzanna Obey, M.D.  Electronically Signed by Peggye Pitt M.D. on 08/14/2011 03:47:09 PM

## 2011-08-15 DIAGNOSIS — C169 Malignant neoplasm of stomach, unspecified: Secondary | ICD-10-CM

## 2011-08-15 LAB — BASIC METABOLIC PANEL
Calcium: 8.3 mg/dL — ABNORMAL LOW (ref 8.4–10.5)
Chloride: 97 mEq/L (ref 96–112)
Creatinine, Ser: 0.46 mg/dL — ABNORMAL LOW (ref 0.50–1.10)
GFR calc Af Amer: >90 mL/min (ref >90)
GFR calc non Af Amer: >90 mL/min (ref >90)

## 2011-08-15 LAB — DIFFERENTIAL
Basophils Absolute: 0 10*3/uL (ref 0.0–0.1)
Eosinophils Absolute: 0 10*3/uL (ref 0.0–0.7)
Lymphocytes Relative: 10 % — ABNORMAL LOW (ref 12–46)
Neutro Abs: 2.2 10*3/uL (ref 1.7–7.7)
Neutrophils Relative %: 81 % — ABNORMAL HIGH (ref 43–77)

## 2011-08-15 LAB — CBC
MCV: 94.4 fL (ref 78.0–100.0)
Platelets: 110 10*3/uL — ABNORMAL LOW (ref 150–400)
RDW: 14.7 % (ref 11.5–15.5)
WBC: 2.8 10*3/uL — ABNORMAL LOW (ref 4.0–10.5)

## 2011-08-15 LAB — MAGNESIUM: Magnesium: 1.6 mg/dL (ref 1.5–2.5)

## 2011-08-16 DIAGNOSIS — C169 Malignant neoplasm of stomach, unspecified: Secondary | ICD-10-CM

## 2011-08-16 LAB — CBC
MCH: 31.7 pg (ref 26.0–34.0)
MCHC: 33.6 g/dL (ref 30.0–36.0)
MCV: 94.4 fL (ref 78.0–100.0)
Platelets: 121 10*3/uL — ABNORMAL LOW (ref 150–400)
RDW: 14.9 % (ref 11.5–15.5)

## 2011-08-16 LAB — DIFFERENTIAL
Basophils Relative: 0 % (ref 0–1)
Eosinophils Absolute: 0 10*3/uL (ref 0.0–0.7)
Lymphs Abs: 0.3 10*3/uL — ABNORMAL LOW (ref 0.7–4.0)
Monocytes Absolute: 0.1 10*3/uL (ref 0.1–1.0)
Neutro Abs: 3.2 10*3/uL (ref 1.7–7.7)
Neutrophils Relative %: 89 % — ABNORMAL HIGH (ref 43–77)

## 2011-08-17 ENCOUNTER — Inpatient Hospital Stay (HOSPITAL_COMMUNITY): Payer: Medicare Other

## 2011-08-17 ENCOUNTER — Encounter (HOSPITAL_COMMUNITY): Payer: Self-pay | Admitting: Radiology

## 2011-08-17 DIAGNOSIS — R1310 Dysphagia, unspecified: Secondary | ICD-10-CM

## 2011-08-17 DIAGNOSIS — C169 Malignant neoplasm of stomach, unspecified: Secondary | ICD-10-CM

## 2011-08-17 LAB — DIFFERENTIAL
Basophils Relative: 0 % (ref 0–1)
Lymphocytes Relative: 15 % (ref 12–46)
Lymphs Abs: 0.3 10*3/uL — ABNORMAL LOW (ref 0.7–4.0)
Monocytes Relative: 1 % — ABNORMAL LOW (ref 3–12)
Neutro Abs: 2 10*3/uL (ref 1.7–7.7)

## 2011-08-17 LAB — CBC
Hemoglobin: 7.2 g/dL — ABNORMAL LOW (ref 12.0–15.0)
MCH: 31.7 pg (ref 26.0–34.0)
MCHC: 33.8 g/dL (ref 30.0–36.0)
MCV: 93.8 fL (ref 78.0–100.0)
RBC: 2.27 MIL/uL — ABNORMAL LOW (ref 3.87–5.11)

## 2011-08-17 LAB — BASIC METABOLIC PANEL
BUN: 12 mg/dL (ref 6–23)
CO2: 25 mEq/L (ref 19–32)
Chloride: 99 mEq/L (ref 96–112)
Glucose, Bld: 82 mg/dL (ref 70–99)
Potassium: 3.2 mEq/L — ABNORMAL LOW (ref 3.5–5.1)

## 2011-08-18 ENCOUNTER — Other Ambulatory Visit: Payer: Self-pay | Admitting: Gastroenterology

## 2011-08-18 DIAGNOSIS — K209 Esophagitis, unspecified: Secondary | ICD-10-CM

## 2011-08-18 LAB — CBC
MCH: 31.8 pg (ref 26.0–34.0)
Platelets: 123 10*3/uL — ABNORMAL LOW (ref 150–400)
RBC: 2.45 MIL/uL — ABNORMAL LOW (ref 3.87–5.11)

## 2011-08-18 LAB — BASIC METABOLIC PANEL
BUN: 9 mg/dL (ref 6–23)
Chloride: 98 mEq/L (ref 96–112)
Creatinine, Ser: 0.52 mg/dL (ref 0.50–1.10)
GFR calc Af Amer: >90 mL/min (ref >90)
GFR calc non Af Amer: >90 mL/min (ref >90)
Potassium: 4.3 mEq/L (ref 3.5–5.1)

## 2011-08-18 LAB — DIFFERENTIAL
Band Neutrophils: 6 % (ref 0–10)
Basophils Relative: 0 % (ref 0–1)
Eosinophils Relative: 0 % (ref 0–5)
Lymphocytes Relative: 12 % (ref 12–46)
Monocytes Absolute: 0.2 10*3/uL (ref 0.1–1.0)
Monocytes Relative: 8 % (ref 3–12)

## 2011-08-18 LAB — PROTIME-INR: Prothrombin Time: 20.5 seconds — ABNORMAL HIGH (ref 11.6–15.2)

## 2011-08-19 ENCOUNTER — Inpatient Hospital Stay (HOSPITAL_COMMUNITY): Payer: Medicare Other

## 2011-08-19 ENCOUNTER — Ambulatory Visit: Payer: Medicare Other | Admitting: Radiation Oncology

## 2011-08-19 DIAGNOSIS — C160 Malignant neoplasm of cardia: Secondary | ICD-10-CM

## 2011-08-19 DIAGNOSIS — K222 Esophageal obstruction: Secondary | ICD-10-CM

## 2011-08-19 LAB — CULTURE, BLOOD (ROUTINE X 2)
Culture  Setup Time: 201210251013
Culture: NO GROWTH

## 2011-08-19 LAB — CBC
Hemoglobin: 7.9 g/dL — ABNORMAL LOW (ref 12.0–15.0)
MCH: 31.9 pg (ref 26.0–34.0)
MCV: 94 fL (ref 78.0–100.0)
RBC: 2.48 MIL/uL — ABNORMAL LOW (ref 3.87–5.11)

## 2011-08-19 LAB — DIFFERENTIAL
Band Neutrophils: 1 % (ref 0–10)
Lymphocytes Relative: 22 % (ref 12–46)
Lymphs Abs: 0.4 10*3/uL — ABNORMAL LOW (ref 0.7–4.0)
Monocytes Absolute: 0.1 10*3/uL (ref 0.1–1.0)
Myelocytes: 1 %

## 2011-08-20 LAB — CBC
HCT: 25.1 % — ABNORMAL LOW (ref 36.0–46.0)
MCV: 94.4 fL (ref 78.0–100.0)
Platelets: 135 10*3/uL — ABNORMAL LOW (ref 150–400)
RBC: 2.66 MIL/uL — ABNORMAL LOW (ref 3.87–5.11)
WBC: 3 10*3/uL — ABNORMAL LOW (ref 4.0–10.5)

## 2011-08-20 LAB — DIFFERENTIAL
Basophils Relative: 1 % (ref 0–1)
Lymphocytes Relative: 25 % (ref 12–46)
Monocytes Relative: 13 % — ABNORMAL HIGH (ref 3–12)
Neutro Abs: 1.8 10*3/uL (ref 1.7–7.7)

## 2011-08-21 NOTE — Progress Notes (Signed)
Charlene Black, Charlene Black                ACCOUNT NO.:  [id number]  MEDICAL RECORD NO.:  [id number]  LOCATION:  1414                         FACILITY:  [facility]  PHYSICIAN:  Richarda Overlie, MD       DATE OF BIRTH:  05/21/1934                                PROGRESS NOTE   PRIMARY CARE PHYSICIAN: [name], M.D.  PRIMARY ONCOLOGIST: Laurice Record, M.D.  CURRENT DIAGNOSES: 1. Neutropenic fever, status post Neulasta. 2. Epistaxis secondary to supratherapeutic INR. 3. Atrial fibrillation on chronic anticoagulation. 4. History of adenocarcinoma of the stomach status post chemotherapy     and radiation therapy. 5. History of uterine cancer. 6. History of deep venous thrombosis. 7. Depression. 8. Gastroesophageal reflux disease. 9. History of lymphoma. 10.Dyslipidemia. 11.Hypothyroidism. 12.History of urinary incontinence.  SUBJECTIVE: This is a 77 year old female with multiple medical problems, diagnosed with gastric cancer in August 2012, under treatment of Dr. Dalene Carrow, currently undergoing chemotherapy and radiation therapy who presented with severe nosebleeds and a supratherapeutic INR.  The patient was seen by ENT last Friday and was cleared to go back on Coumadin.  Within a couple of hours, the patient started developing epistaxis and presented with an INR of 4.2 that peaked at 5.66.  She had a cardiology consultation done during this hospitalization and recommendation was made to discontinue the anticoagulation for now and if the patient were to develop recurrent DVT, then she would qualify for an IVC filter. 1. Dysphagia.  The patient was found to be neutropenic and also found     to have profound dysphagia.  Because of her neutropenia, the     patient could not have an endoscopy by the GI.  [facility] was     consulted for her severe dysphagia. She was empirically started on     fluconazole for the possibility of fungal esophagitis.  The patient     did not have any  evidence of thrush and dose of her fluconazole was     adjusted from 200 mg to 100 mg IV daily.  The patient still has     significant difficulty swallowing any pills, liquids, or solid food     and therefore, she is scheduled to have an endoscopy today.  If the     patient has an infectious process which can be treated, then the     patient will stay in the inpatient setting.  If the patient has     worsening of her malignancy or an obstructive lesion contributing     to her dysphagia and if this is related to her underlying gastric     carcinoma, then the patient can be discharged to [facility] with     palliative care measures. 2. Neutropenia and neutropenic fever.  The patient was initially     started on vancomycin and Bactrim and broad-spectrum antibiotics     were initiated by Oncology.  Blood cultures were drawn that were     found to be negative.  The patient was found to have a mild urinary     tract infection and has been switched to ciprofloxacin by Oncology. 3. Nausea, vomiting and headache.  The patient complained of headache     yesterday, which was thought to be secondary to probably antibiotic     related versus persistent nausea, vomiting, however, she had a CT     of her brain that did not show any intracranial abnormalities. 4. Disposition.  The patient had a palliative care consultation for     goals of care and was thought to be appropriate to be sent to     [facility] once the patient's hospitalization and medical workup     is complete and she would like to be a DNR and DNI.  She would not     want any mechanical ventilation or defibrillation or CPR.  The     patient is open to artificial feeding including a PEG tube as     needed and this will depend upon the findings of the endoscopy     tomorrow.  The patient may benefit from an esophageal stent if     cancer is causing obstruction of the esophagus.  The patient, per     Dr. Sharl Ma, is also interested in  residential hospice and may qualify     for [facility].  PHYSICAL EXAMINATION: VITAL SIGNS:  Temperature 98.2, pulse 90, respirations 25, blood pressure 122/81, 96% on room air. GENERAL:  Currently comfortable, in no acute cardiopulmonary distress. HEENT:  Pupils equal and reactive.  Extraocular movements intact. NECK:  Supple.  No JVD. LUNGS:  Clear to auscultation bilaterally.  No wheezes, no crackles, no rhonchi. CARDIOVASCULAR:  Regular rate and rhythm.  No murmurs, rubs, or gallops. ABDOMEN:  Soft, nontender, nondistended.  EXTREMITIES:  Without cyanosis, clubbing, or edema.     Richarda Overlie, MD     NA/MEDQ  D:  08/18/2011  T:  08/18/2011  Job:  [id number]  Electronically Signed by Richarda Overlie MD on 08/21/2011 10:45:13 AM

## 2011-08-21 NOTE — Discharge Summary (Signed)
[name], Charlene Black                ACCOUNT NO.:  [id number]  MEDICAL RECORD NO.:  [id number]  LOCATION:  1414                         FACILITY:  [facility]  PHYSICIAN:  Kela Millin, M.D.DATE OF BIRTH:  1934-07-10  DATE OF ADMISSION:  08/11/2011 DATE OF DISCHARGE:  08/20/2011                        DISCHARGE SUMMARY - REFERRING   FINAL DIAGNOSES: 1. Neutropenic fevers. 2. Epistaxis secondary to supratherapeutic INR - Coumadin     discontinued. 3. Odynophagia - inpatient, status post radiation therapy, fungal     versus viral.  Status post esophageal stent placement, GI. 4. Adenocarcinoma of the stomach, status post chemotherapy and     radiation. 5. History of uterine cancer. 6. History of deep venous thrombosis. 7. Depression. 8. Gastroesophageal reflux disease. 9. History of lymphoma. 10.Dyslipidemia. 11.Hypothyroidism. 12.History of urinary incontinence. 13.Atrial fibrillation - Coumadin discontinued, this hospital stay.14.History of Marfan syndrome. 15.History of irritable bowel syndrome. 16.History of type 2 diabetes mellitus. 17.Peripheral neuropathy. 18.History of stage 2 chronic kidney disease. 19.History of pernicious anemia. 20.History of transient ischemic attack.  PROCEDURES AND STUDIES: 1. Chest x-ray on August 11, 2011 - stable cardiomegaly.  No acute     findings. 2. CT scan of head without contrast - atrophy and small vessel     disease.  No acute intracranial findings.  Ill-defined left frontal     osseous lucency remains indeterminate for metastatic disease. 3. Abdominal x-rays on August 19, 2011 -stent placement across the GE     junction without apparent complication. 4. Esophageal stent placement per Dr. Arlyce Dice on August 19, 2011. 5. EGD.  CONSULTATIONS: 1. Oncology - Laurice Record, M.D. 2. Cardiology - Georga Hacking, M.D. 3. Palliative care - Mauro Kaufmann, MD.  BRIEF HISTORY:  The patient is a 77 year old white female with the  above- listed medical problems, diagnosed with gastric cancer in August, 2012, and followed by Dr. Dalene Carrow, who had been receiving chemotherapy and radiation therapy and presented with complaints of nausea, vomiting, chest pain, and general decline.  Please see the full admission history and physical dictated on August 11, 2011, by Dr. Betti Cruz for the details of the admission history, physical exam, and laboratory data.  It also was noted that the patient had recently been hospitalized earlier in October 2012, with nosebleeds and her INR was supratherapeutic, and Coumadin was held and subsequently she was cleared by ENT to resume the Coumadin.  HOSPITAL COURSE: 1. Neutropenic fevers - upon admission, the patient was empirically     placed on broad-spectrum antibiotics with vancomycin and Bactrim.     Blood cultures were obtained prior to that, came back negative.  A     urinalysis was done and was consistent with a urinary tract     infection and her antibiotics were narrowed to ciprofloxacin, and     she would have completed 7 days of empiric Cipro today.  No urine     cultures were sent per E chart records.  The patient has remained     afebrile.  She was treated with Neulasta during this hospital stay     per Oncology and her white cell count today prior to discharge is  3.0. 2. Epistaxis/supratherapeutic INR - her Coumadin was held during this     hospital stay.  Cardiology was consulted and Dr. Donnie Aho saw the     patient.  She does have a history of atrial fibrillation, and     recommended to discontinue the Coumadin for now and this was done. 3. Odynophagia - the patient was having painful swallowing and GI was     consulted, and they followed the patient and an EGD was done on     August 19, 2011, and a stent was placed as well.  The impression     was that it was likely secondary to the radiation she had been     receiving versus viral versus fungal.  The patient was treated  with     Diflucan and is to continue the Diflucan per GI recommendations for     one more week. 4. Gastric cancer - Dr. Dalene Carrow was consulted and followed the patient     during this hospital stay.  She recommended palliative care     consultation for goals of care and this was done, and her wishes     were to be a do not resuscitate, do not intubate, and she indicated     that she would be open to artificial feeding including a PEG tube     as needed, but that was going to be dependent on the findings of     the endoscopy.  As discussed above, the patient had a stent placed     with the EGD per Dr. Arlyce Dice.  Also, the patient stated that she was     interested in residential hospice and the social work has assisted     with her placement at [facility], and she has a bed available     there today.  She will be discharged to that facility.  The patient     indicated that she did not want any further chemotherapy or     radiation.  DISCHARGE MEDICATIONS: 1. Fentanyl patch 50 mcg q.72 hours. 2. Diflucan 200 mg p.o. daily for 7 more days. 3. Magic mouthwash 5 to 10 mL q.4 hours p.r.n. 4. Metoprolol 12.5 mg p.o. b.i.d. 5. Morphine IV 2 to 4 mg q.4 hours p.r.n. 6. Zofran 4 mg IV q.6 hours p.r.n. 7. Sucralfate 1 g p.o. q.i.d. 8. Biotin rinses 1 tablespoons t.i.d. p.r.n. 9. Brimonidine ophthalmic solution 0.15% one drop in both eyes b.i.d. 10.Carbidopa/levodopa 10/100 mg one tablet p.o. b.i.d. 11.Colace 100 mg one p.o. b.i.d. 12.Cymbalta 20 mg one p.o. daily. 13.Dulcolax 5 mg p.o. daily. 14.Flonase nasal spray, 2 sprays nasally b.i.d. 15.Lortab 7.5/500 mg 2 to 3 teaspoons q.4 hours p.r.n. 16.Megace 40 mg/mL 2 teaspoons daily. 17.Miconazole 2% powder, apply topically p.r.n. 18.MiraLax 17 g daily. 19.Neurontin 600 mg 2 tablets t.i.d. 20.Nexium 40 mg p.o. daily. 21.Oxybutynin XL 10 mg p.o. every other day. 22.Discontinue medications - Tylenol, tramadol, niacin, dexamethasone,      Certavite, and Coumadin.  DISPOSITION:  The patient has been discharged to [facility] at this time.     Kela Millin, M.D.     ACV/MEDQ  D:  08/20/2011  T:  08/20/2011  Job:  [id number]  cc:   [name], M.D. Fax: [phone]  Electronically Signed by Donnalee Curry M.D. on 08/21/2011 07:34:45 AM

## 2011-08-23 NOTE — Consult Note (Signed)
Charlene Black, MULDREW NO.:  [id number]  MEDICAL RECORD NO.:  [id number]  LOCATION:  1414                         FACILITY:  [facility]  PHYSICIAN:  Doylene Canning. [name], MD    DATE OF BIRTH:  05/16/1934  DATE OF CONSULTATION:  08/13/2011 DATE OF DISCHARGE:                                CONSULTATION   PRIMARY CARDIOLOGIST:  [name], M.D.  PRIVATE CARE PROVIDER:  [name], M.D.  ONCOLOGIST:  Dr. Dalene Carrow.  ENT:  Dr. Suzanna Obey.  PATIENT PROFILE:  A 77 year old female with history of atrial fibrillation, on chronic Coumadin therapy who was admitted on August 11, 2011, with progressive weakness whom we have been asked to evaluate to discuss Coumadin therapy in the setting of recent severe epistaxis.  PROBLEMS: 1. History of chest pain. August 04, 2010, Myoview, EF 65%.  No ischemia or infarct. 1. Gastric CA diagnosed in August 2012, status post chemotherapy and     radiation initiated in October 2012. 2. History of severe epistaxis. 3. Atrial fibrillation, on Coumadin therapy, currently on hold. 4. Hypertension. 5. Hyperlipidemia. 6. Type 2 diabetes mellitus. 7. Moderate mitral valve prolapse. April 26, 2011, 2D echocardiogram, EF 65-70%.  No regional wall motion abnormalities.  Moderate mitral valve prolapse and mild mitral regurgitation.  Mild to moderate biatrial dilatation.  PASP 40 mmHg. 1. History of Marfan syndrome. 2. Irritable bowel syndrome. 3. Pernicious anemia. 4. Peripheral neuropathy. 5. History of subarachnoid cyst in cervical spine. 6. Stage 2 chronic kidney disease. 7. Chronic constipation. 8. Chronic urinary retention. 9. History of DVT in 2006. 10.Uterine cancer. 11.Glaucoma. 12.Depression. 13.History of TIA. 14.GERD. 15.Oral candidiasis. 16.Pancytopenia secondary to chemotherapy and radiation. 17.Neutropenic fever. 18.History of right ankle fracture in August 2012.  ALLERGIES:  To SULFA, AMPICILLIN, CEPHALOSPORINS,  CODEINE, GENTAMICIN, TETRACYCLINE, BACTRIM, PREDNISONE, BENADRYL, TOBRAMYCIN, AMIKACIN, NALIDIXIC ACID, MACROBID, and MANDELAMINE.  HISTORY OF PRESENT ILLNESS:  A 77 year old female with the above complex problem list.  The patient was relatively recently diagnosed with gastric cancer in August 2012 and has been followed by Dr. Dalene Carrow, and has been initiated on chemotherapy and radiation  earlier this month. The patient also has history of atrial fibrillation which is rate controlled and is on chronic Coumadin therapy.  She was seen in the ER on July 31, 2011, with severe nose bleed and Coumadin was held.  She subsequently was seen by ENT last Friday and cleared to go back on Coumadin.  Within a couple of hours of taking a dose, the patient developed recurrent epistaxis, but continued to take Coumadin over the weekend.  Over the same period of time, she has been developing progressive weakness with dysphasia and epigastric discomfort as well as nausea, vomiting, and anorexia.  Because of her malaise and weakness, she did not undergo radiation on August 11, 2011, instead was admitted for evaluation of hydration.  She has been receiving IV Diflucan for oral candidiasis and some suggestion of esophagitis and has also been treated with antibiotics for a neutropenic fever with a T-max of 101.5 this morning.  We have been asked to comment on possible reinstitution of Coumadin anticoagulation in the setting of her atrial fibrillation.  Remote history of DVT.  The patient and family have significant concerns with regards to bleeding risk at this point.  CURRENT MEDICATIONS: 1. Biotene rinse 15 mL t.i.d. 2. Azactam 2 g IV q.8 hours. 3. Dulcolax 5 mg daily. 4. Alphagan 1 drop both eyes b.i.d. 5. Sinemet 1 tab b.i.d. 6. Colace 1 mg b.i.d. 7. Cymbalta 20 mg daily. 8. Neupogen 480 mcg subcu daily. 9. Diflucan 200 mg IV daily. 10.Neurontin 1200 mg t.i.d. 11.Lopressor 12.5 mg  b.i.d. 12.Miconazole q.8 hours. 13.Ditropan 10 mg every other day. 14.Protonix 80 mg daily. 15.MiraLax 17 g daily. 16.Vancomycin 1000 mg q.12 hours.  FAMILY HISTORY:  Mother died with history of coronary disease and liver cancer.  Father died of leukemia and coronary artery disease.  She had a sister who died of coronary disease.  SOCIAL HISTORY:  The patient lives in [place].  She is a retired Solicitor.  She denies tobacco, alcohol, drug use.  No routine exercise.  REVIEW OF SYSTEMS:  Notable for a general malaise, weakness, recent epistaxis with continued intermittent epistaxis.  She has been having dysphasia and epigastric discomfort.  She has been profoundly weak with anorexia, nausea, and vomiting, which have been improving.  She is a full code.  Otherwise all systems reviewed negative.  PHYSICAL EXAMINATION:  VITAL SIGNS:  Temperature 101.5, heart rate 97, respirations 18, blood pressure 129/83, and pulse ox 95% on room air. Weight is 81.2 kilos. GENERAL:  Pleasant white female, in no acute distress.  Awake, alert, and oriented x3.  She has normal affect. HEENT:  Normal.  Nares grossly intact.  Nonfocal. SKIN:  Warm and dry without lesions or masses. NECK:  Supple without bruits or JVD. LUNGS:  Respirations regular and unlabored.  Diminished breath sounds bilaterally. CARDIAC:  Irregularly regular.  S1 and S2.  No S3, S4, or murmurs. ABDOMEN:  Round, soft, nontender, nondistended.  Bowel sounds present. EXTREMITIES:  All four extremities warm, dry, and pink.  No clubbing, cyanosis, or edema.  Dorsalis pedis, posterior tibial pulses 2+ equal bilaterally.  She has a splint to her right ankle.  Chest x-ray shows stable cardiomegaly.  No acute findings.  EKG shows AFib rate of 105, no acute ST-T changes.  Hemoglobin 8.8, hematocrit 25.8, WBC 0.6, and platelets 97.  Sodium 133, potassium 3.7, chloride 101, CO2 of 24, BUN 16, creatinine 0.60, and glucose 120.   Total bilirubin 0.3, alkaline phosphatase 44, AST 22, ALT 27, total protein 6.4, albumin 3.2, INR 2.27 down from 6.83 on admission.  Cardiac markers negative x3.  Blood cultures pending x2.  Urinalysis shows moderate leukocytes, cloudy appearance and many bacteria, but negative nitrite.  ASSESSMENT: 1. Atrial fibrillation.  This is reasonably well rate controlled and     would continue low-dose beta-blocker.  At this point, risks     associated with anticoagulation namely bleeding far exceed     benefits.  Recommend discontinuation altogether.  If clinical     picture improves in the future, we can readdress, but for now,     would not anticoagulate.  Could consider low-dose aspirin in the     future though this is likely of little benefit.  Heart rate which     are currently running in the high 90s to low 100s are likely driven     by illness/fever/dehydration.  We would continue to follow. 2. History of deep vein thrombosis.  This is a remote history of about     6 years ago.  If the patient has recurrent deep vein thrombosis,     would recommend inferior vena cava filter, but as above, would     avoid Coumadin anticoagulation at this point.     Nicolasa Ducking, ANP   ______________________________ Doylene Canning. [name], MD    CB/MEDQ  D:  08/13/2011  T:  08/13/2011  Job:  [id number]  Electronically Signed by Nicolasa Ducking ANP on 08/22/2011 02:03:12 PM Electronically Signed by Lewayne Bunting MD on 08/22/2011 11:59:30 PM

## 2011-08-24 ENCOUNTER — Ambulatory Visit: Payer: Medicare Other

## 2011-08-25 ENCOUNTER — Ambulatory Visit: Payer: Medicare Other

## 2011-08-26 ENCOUNTER — Ambulatory Visit: Payer: Medicare Other

## 2011-08-27 ENCOUNTER — Ambulatory Visit: Payer: Medicare Other

## 2011-08-27 ENCOUNTER — Other Ambulatory Visit: Payer: Self-pay | Admitting: Hematology and Oncology

## 2011-08-28 ENCOUNTER — Telehealth: Payer: Self-pay | Admitting: Hematology and Oncology

## 2011-08-28 NOTE — Telephone Encounter (Signed)
Per order from LO cx all appointments pt at [facility]. appt no existing appts.

## 2011-09-14 ENCOUNTER — Encounter: Payer: Self-pay | Admitting: Radiation Oncology

## 2011-09-14 NOTE — Progress Notes (Signed)
[facility] Radiation Oncology END OF TREATMENT SUMMARY  Name: Charlene Black MRN: [id number]  Date: 09/14/2011  DOB: Jun 22, 1934  Status:inpatient    CC: Pamelia Hoit, MD      DIAGNOSIS: Gastric Cardia Adenocarcinoma  INDICATION FOR TREATMENT: local regional control   TREATMENT DATES: 07-22-11 to 08-10-11   SITE/DOSE: gastric cardia and nodes / 2340 cGy in 13 fractions (out of a total planned dose of 5040cGy)   BEAMS/ENERGY: four field / 15 MV photons   NARRATIVE: [name], suffering from many comorbidities, had difficulty during chemoradiation.  She developed severe epistaxis, nausea, poor PO intake, and failure to thrive. She was admitted to the hospital twice and ultimately decided to decline aggressive measures. She enrolled in hospice thereafter.  I discussed this decision in depth with the patient and her daughter and supported her wishes.   PLAN: I will see her back PRN.

## 2011-09-19 DEATH — deceased

## 2011-09-29 ENCOUNTER — Telehealth: Payer: Self-pay | Admitting: Genetic Counselor

## 2011-12-30 ENCOUNTER — Encounter: Payer: Self-pay | Admitting: *Deleted

## 2012-11-12 IMAGING — CR DG CHEST 1V PORT
1 series · 1 of 1 positions shown · non-contrast
Comparison: 07/09/2011

CLINICAL DATA: Vomiting.  Weakness.  Undergoing radiation therapy
for gastric carcinoma.

PORTABLE CHEST - 1 VIEW

[AP]
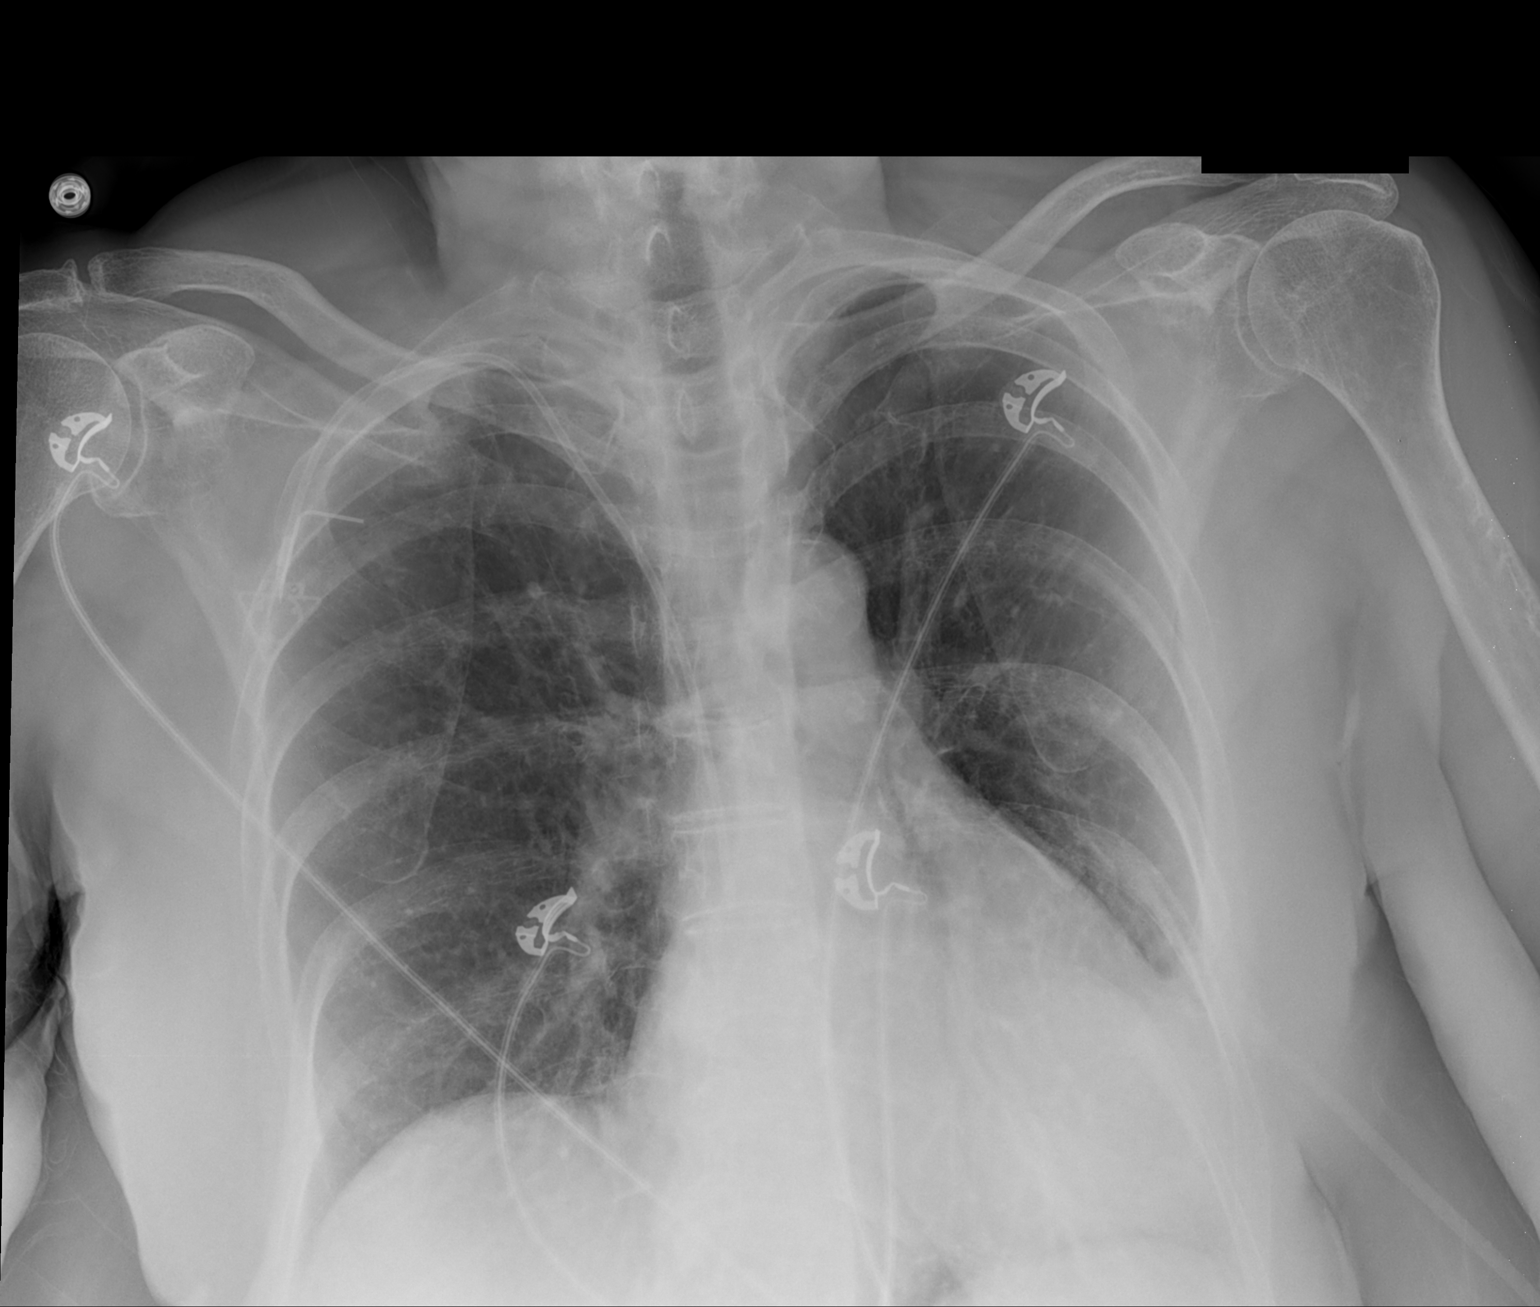

[1 of 1 positions shown; findings below may reference images not displayed]

FINDINGS: Cardiomegaly stable.  Both lungs are clear.  Right-sided
power port remains in appropriate position.
IMPRESSION: Stable cardiomegaly.  No acute findings.

## 2012-11-18 IMAGING — CT CT HEAD W/O CM
1 of 2 series · 16 of 30 positions shown, 20 images · non-contrast
Comparison: 06/09/2011 mostrecent.  Also CT head 07/31/2010.

CLINICAL DATA: Failure to thrive.  History of gastric cancer.

CT HEAD WITHOUT CONTRAST
TECHNIQUE: Contiguous axial images were obtained from the base of
the skull through the vertex without contrast.

[Series 2: head_seq 4.5 h37s st · axial · 0.43mm/px · z∈[-123,+3]mm · 16 of 32 slices shown, 20 images]
[im 2/32  brain]
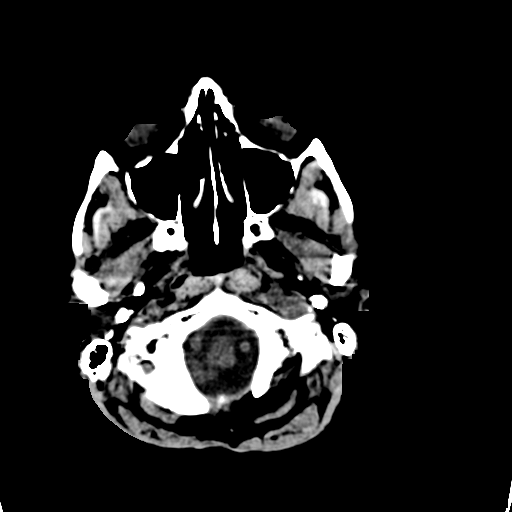
[im 2/32  bone]
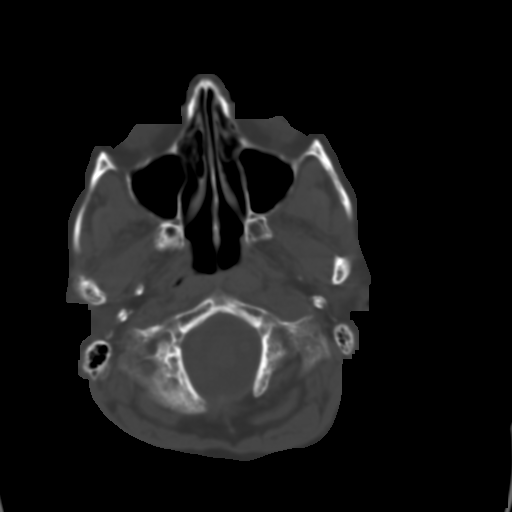
[im 3/32  brain]
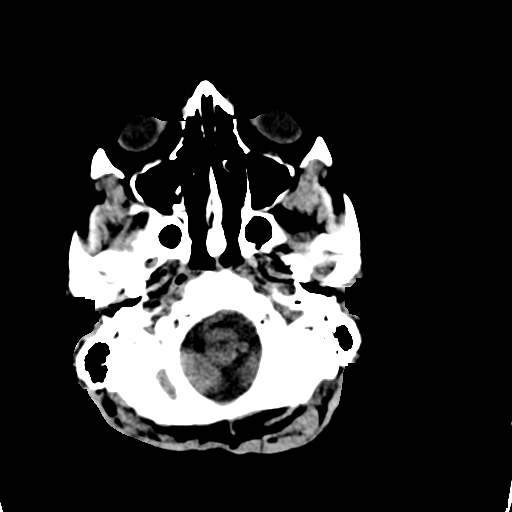
[im 6/32  brain]
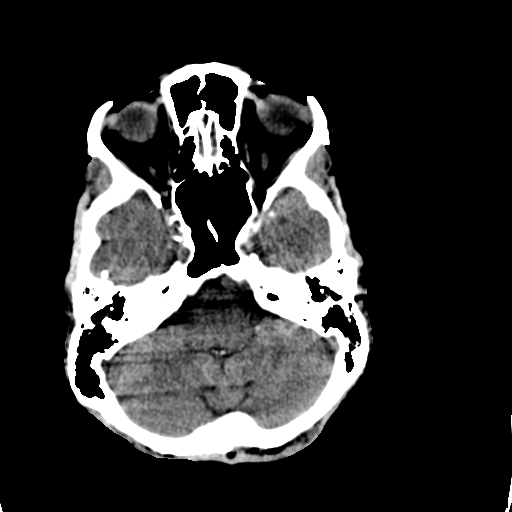
[im 8/32  brain]
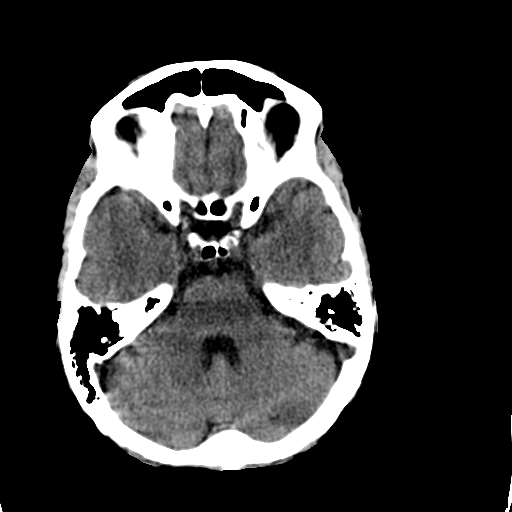
[im 9/32  brain]
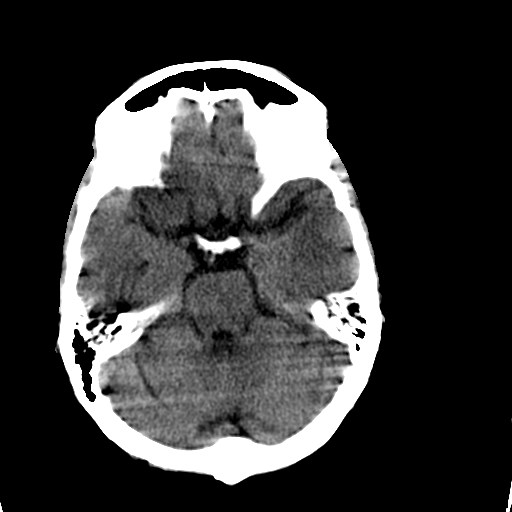
[im 9/32  bone]
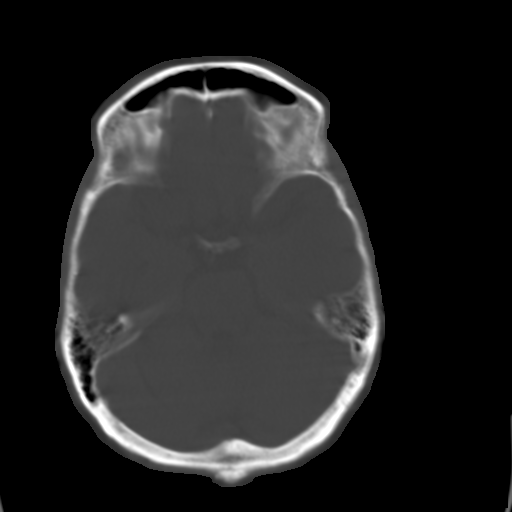
[im 11/32  brain]
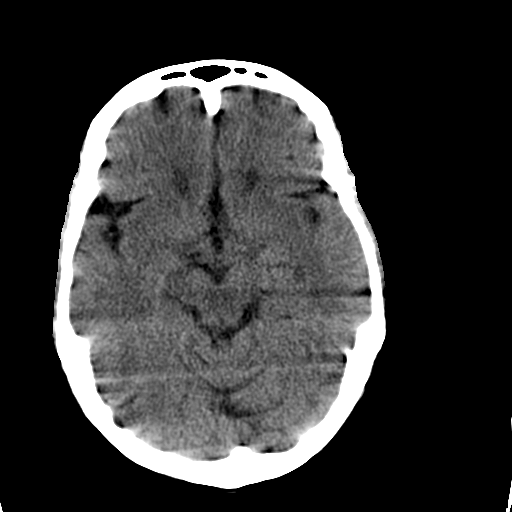
[im 14/32  brain]
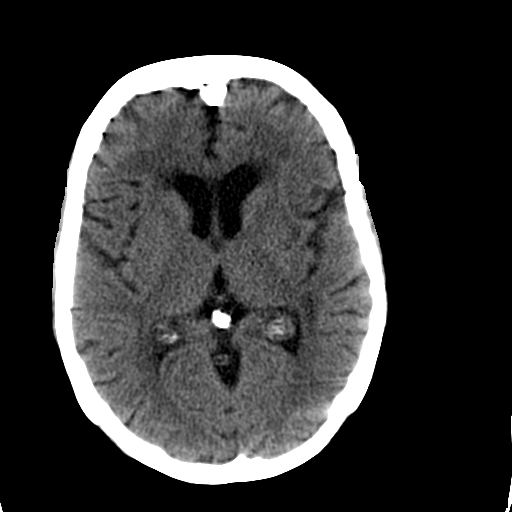
[im 15/32  brain]
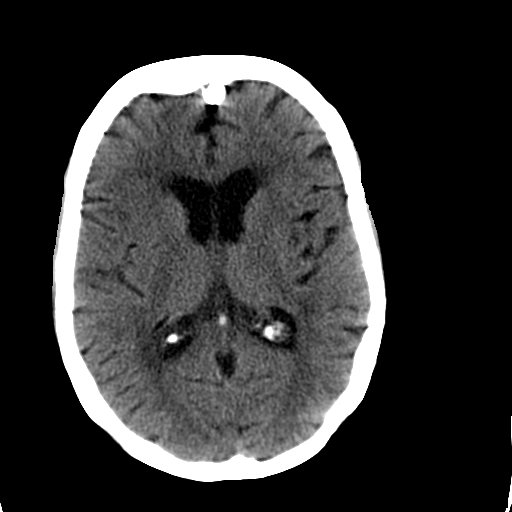
[im 17/32  brain]
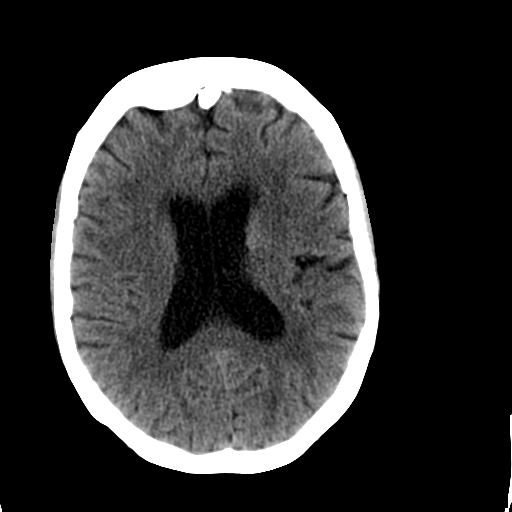
[im 17/32  bone]
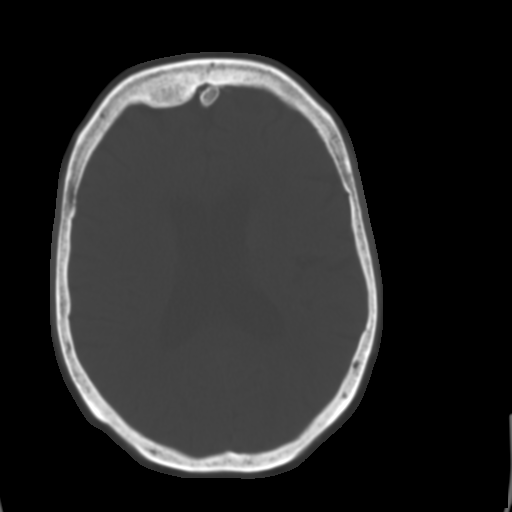
[im 18/32  brain]
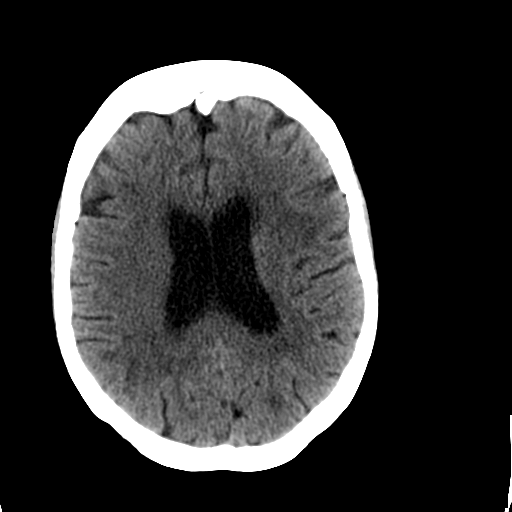
[im 21/32  brain]
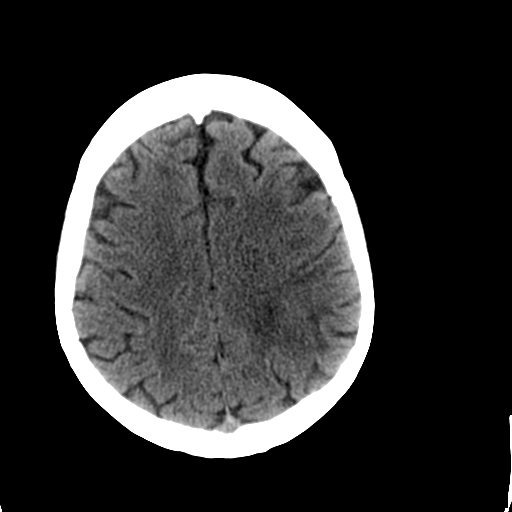
[im 23/32  brain]
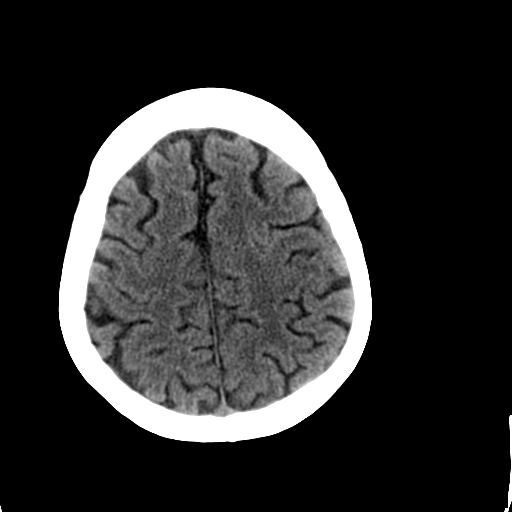
[im 24/32  brain]
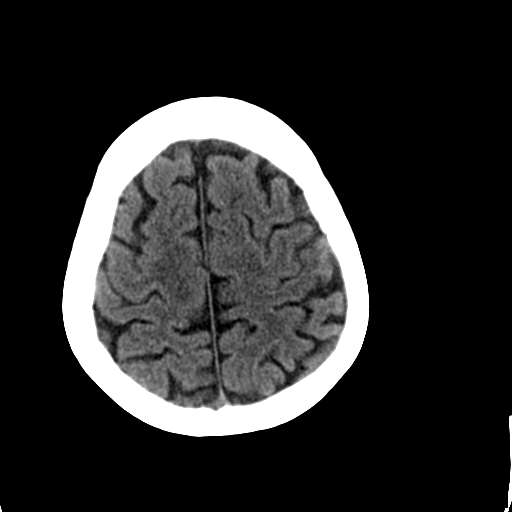
[im 24/32  bone]
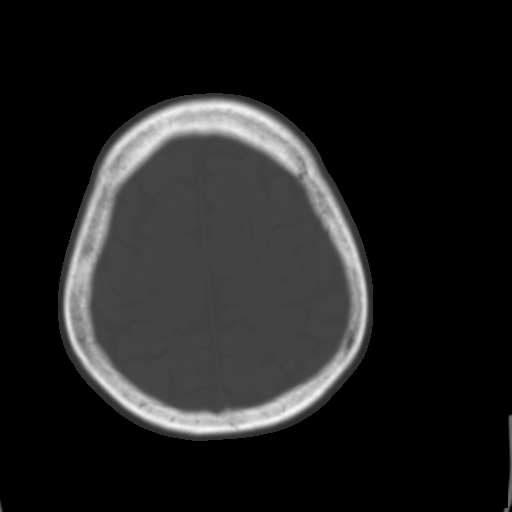
[im 26/32  brain]
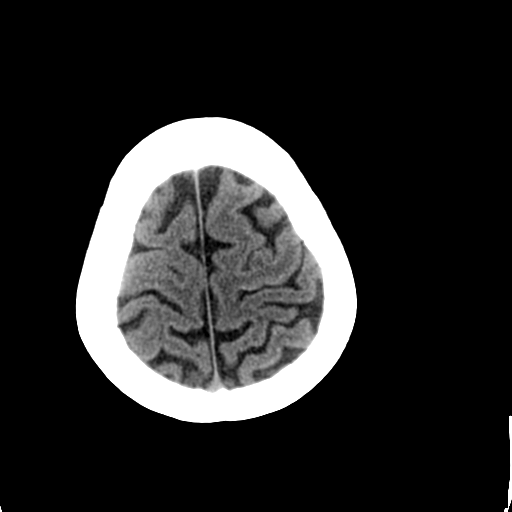
[im 29/32  brain]
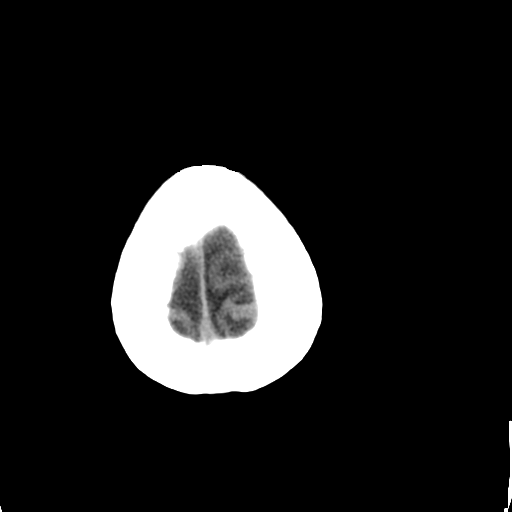
[im 30/32  brain]
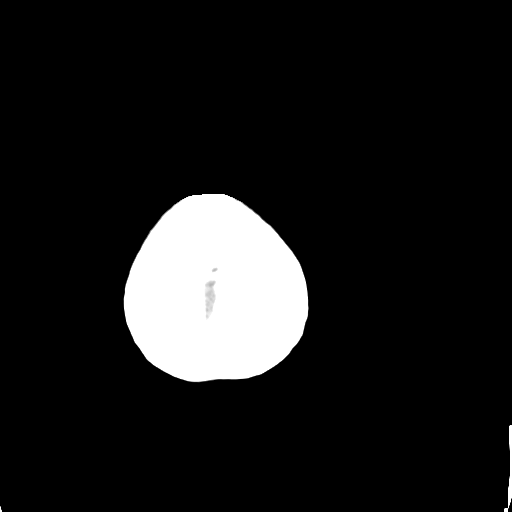

[16 of 30 positions shown; findings below may reference images not displayed]

FINDINGS: There is no evidence for acute infarction, intracranial
hemorrhage, mass lesion, hydrocephalus, or extra-axial fluid.  Mild
atrophy is present.  There is chronic microvascular ischemic
change. Ill-defined left frontal bone slight lucency remains
suspicious for osseous metastatic disease and stable from
07/31/2010.  Sinuses and mastoids clear.  Little change priors.
Noncontrast examination does not exclude intracranial metastatic
deposits.
IMPRESSION: Atrophy and small vessel disease.  No acute intracranial findings.
Ill-defined left frontal osseous lucency remains indeterminate for
metastatic disease.

## 2014-02-05 NOTE — Telephone Encounter (Signed)
Please see Visit Info comments
